# Patient Record
Sex: Female | Born: 1965
Health system: Southern US, Community
[De-identification: ages and names within clinical notes are randomized; demographics above are authoritative.]

## PROBLEM LIST (undated history)

## (undated) DIAGNOSIS — A159 Respiratory tuberculosis unspecified: Secondary | ICD-10-CM

## (undated) DIAGNOSIS — C44310 Basal cell carcinoma of skin of unspecified parts of face: Secondary | ICD-10-CM

## (undated) DIAGNOSIS — N809 Endometriosis, unspecified: Secondary | ICD-10-CM

## (undated) DIAGNOSIS — D219 Benign neoplasm of connective and other soft tissue, unspecified: Secondary | ICD-10-CM

## (undated) DIAGNOSIS — M722 Plantar fascial fibromatosis: Secondary | ICD-10-CM

## (undated) HISTORY — DX: Respiratory tuberculosis unspecified: A15.9

## (undated) HISTORY — DX: Plantar fascial fibromatosis: M72.2

## (undated) HISTORY — DX: Endometriosis, unspecified: N80.9

## (undated) HISTORY — PX: LEFT OOPHORECTOMY: SHX1961

## (undated) HISTORY — PX: OTHER SURGICAL HISTORY: SHX169

## (undated) HISTORY — PX: WISDOM TOOTH EXTRACTION: SHX21

## (undated) HISTORY — DX: Basal cell carcinoma of skin of unspecified parts of face: C44.310

## (undated) HISTORY — DX: Benign neoplasm of connective and other soft tissue, unspecified: D21.9

---

## 1994-06-20 HISTORY — PX: OVARY SURGERY: SHX727

## 2006-04-17 ENCOUNTER — Encounter (INDEPENDENT_AMBULATORY_CARE_PROVIDER_SITE_OTHER): Payer: Self-pay | Admitting: Gynecology

## 2006-04-17 ENCOUNTER — Ambulatory Visit: Payer: Self-pay | Admitting: Gynecology

## 2007-05-07 ENCOUNTER — Ambulatory Visit: Payer: Self-pay | Admitting: Gynecology

## 2007-05-07 ENCOUNTER — Encounter: Payer: Self-pay | Admitting: Obstetrics & Gynecology

## 2007-07-19 ENCOUNTER — Encounter: Admission: RE | Admit: 2007-07-19 | Discharge: 2007-07-19 | Payer: Self-pay | Admitting: Obstetrics & Gynecology

## 2007-10-08 ENCOUNTER — Ambulatory Visit: Payer: Self-pay | Admitting: Gynecology

## 2010-07-11 ENCOUNTER — Encounter: Payer: Self-pay | Admitting: Obstetrics & Gynecology

## 2010-11-02 NOTE — Assessment & Plan Note (Signed)
NAME:  ANNAYAH, WORTHLEY NO.:  [id number]   MEDICAL RECORD NO.:  [id number]          PATIENT TYPE:  POB   LOCATION:  [facility] at [facility]         FACILITY:  [facility]   PHYSICIAN:  Allie Bossier, MD        DATE OF BIRTH:  07/24/1965   DATE OF SERVICE:  05/07/2007                                  CLINIC NOTE   Ms. Scobey is a 45 year old married white gravida 2, para 2 with 27 and 8-  year-old children who comes here for an annual exam.  She has a couple  complaints.  She has been on Loestrin 24 and complains of consistent  breakthrough bleeding during the second week of each pill pack.  She  would like to have the Mirena IUD. She also complains of a persistent  vaginal discharge yellowish to clear and she would like this checked.   PAST MEDICAL HISTORY:  Endometriosis.   PAST SURGICAL HISTORY:  1. Laparoscopy in 1997.  2. Left salpingo-oophorectomy.  3. Bilateral knee surgeries.   ALLERGIES:  NO KNOWN LATEX ALLERGIES.  PENICILLIN IS A DRUG ALLERGY.   SOCIAL HISTORY:  Negative for tobacco, alcohol or drug use.   FAMILY HISTORY:  Negative for breast, GYN and colon malignancies.   REVIEW OF SYSTEMS:  She has been married for the last 8 years.  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  Her Pap smear was done in 03/2006 and a mammogram is due.   PHYSICAL EXAM:  Weight 143 pounds, height 5 foot 8, blood pressure  122/82, pulse 95.  HEENT:  Normal.  HEART:  Regular rate and rhythm.  LUNGS:  Clear to auscultation bilaterally.  ABDOMEN:  Benign.  BREASTS:  Normal.  PELVIC:  External genitalia normal.  Uterus normal size and shape,  anteverted, nonenlarged.  Adnexa-no pelvic masses.  No pain with exam.  Discharge on exam appeared normal.   ASSESSMENT/PLAN:  1. Annual exam.  I have checked a Pap smear with cervical cultures.      Recommended self-breast exams, self vulvar exams monthly and a      mammogram will be scheduled. for bone and  pain p.m. have a      temperature height 980 day.  2. Birth control.  I agree with Mirena as an excellent form of birth      control.  We will put this in with her next menstrual period.  She      will take Motrin prior to insertion.  With regard to her discharge      I will send a wet prep.      Allie Bossier, MD     MCD/MEDQ  D:  05/07/2007  T:  05/08/2007  Job:  [id number]

## 2011-04-04 ENCOUNTER — Encounter: Payer: Self-pay | Admitting: Obstetrics & Gynecology

## 2011-04-04 ENCOUNTER — Ambulatory Visit (INDEPENDENT_AMBULATORY_CARE_PROVIDER_SITE_OTHER): Payer: BC Managed Care – PPO | Admitting: Obstetrics & Gynecology

## 2011-04-04 VITALS — BP 118/76 | HR 89 | Ht 68.0 in | Wt 148.0 lb

## 2011-04-04 DIAGNOSIS — Z1231 Encounter for screening mammogram for malignant neoplasm of breast: Secondary | ICD-10-CM

## 2011-04-04 DIAGNOSIS — Z113 Encounter for screening for infections with a predominantly sexual mode of transmission: Secondary | ICD-10-CM

## 2011-04-04 DIAGNOSIS — Z1272 Encounter for screening for malignant neoplasm of vagina: Secondary | ICD-10-CM

## 2011-04-04 DIAGNOSIS — Z0000 Encounter for general adult medical examination without abnormal findings: Secondary | ICD-10-CM

## 2011-04-04 NOTE — Progress Notes (Signed)
  Subjective:    Patient ID: Wendy Myers, female    DOB: 01-10-1966, 45 y.o.   MRN: [id number]  HPI    Review of Systems     Objective:   Physical Exam        Assessment & Plan:   Subjective:    Wendy Myers is a 45 y.o. female who presents for an annual exam. The patient has no complaints today. The patient is sexually active. GYN screening history: last pap: was normal. The patient wears seatbelts: yes. The patient participates in regular exercise: yes. Has the patient ever been transfused or tattooed?: no. The patient reports that there is not domestic violence in her life.   Menstrual History: OB History    Grav Para Term Preterm Abortions TAB SAB Ect Mult Living   2         2      Menarche age: 38 No LMP recorded. Patient is not currently having periods (Reason: IUD).    The following portions of the patient's history were reviewed and updated as appropriate: allergies, current medications, past family history, past medical history, past social history, past surgical history and problem list.  Review of Systems A comprehensive review of systems was negative.   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 Objective:    BP 118/76  Pulse 89  Ht 5\' 8"  (1.727 m)  Wt 67.132 kg (148 lb)  BMI 22.50 kg/m2  General Appearance:    Alert, cooperative, no distress, appears stated age  Head:    Normocephalic, without obvious abnormality, atraumatic  Eyes:    PERRL, conjunctiva/corneas clear, EOM's intact, fundi    benign, both eyes  Ears:    Normal TM's and external ear canals, both ears  Nose:   Nares normal, septum midline, mucosa normal, no drainage    or sinus tenderness  Throat:   Lips, mucosa, and tongue normal; teeth and gums normal  Neck:   Supple, symmetrical, trachea midline, no adenopathy;    thyroid:  no enlargement/tenderness/nodules; no carotid   bruit or JVD  Back:      Symmetric, no curvature, ROM normal, no CVA tenderness  Lungs:     Clear to auscultation bilaterally, respirations unlabored  Chest Wall:    No tenderness or deformity   Heart:    Regular rate and rhythm, S1 and S2 normal, no murmur, rub   or gallop  Breast Exam:    No tenderness, masses, or nipple abnormality  Abdomen:     Soft, non-tender, bowel sounds active all four quadrants,    no masses, no organomegaly  Genitalia:    Normal female without lesion, discharge or tenderness  Rectal:     Extremities:   Extremities normal, atraumatic, no cyanosis or edema  Pulses:   2+ and symmetric all extremities  Skin:   Skin color, texture, turgor normal, no rashes or lesions  Lymph nodes:   Cervical, supraclavicular, and axillary nodes normal  Neurologic:   CNII-XII intact, normal strength, sensation and reflexes    throughout  .    Assessment:    Healthy female exam.    Plan:     Await pap smear results. Mammogram.  Schedule fasting labs

## 2011-04-06 NOTE — Progress Notes (Signed)
Addended by: Barbara Cower on: 04/06/2011 01:22 PM   Modules accepted: Orders

## 2011-04-07 ENCOUNTER — Other Ambulatory Visit: Payer: Self-pay

## 2011-04-11 ENCOUNTER — Other Ambulatory Visit: Payer: BC Managed Care – PPO

## 2011-04-13 ENCOUNTER — Other Ambulatory Visit: Payer: BC Managed Care – PPO

## 2011-04-28 ENCOUNTER — Ambulatory Visit
Admission: RE | Admit: 2011-04-28 | Discharge: 2011-04-28 | Disposition: A | Discharge status: Home / Self Care | Payer: BC Managed Care – PPO | Source: Ambulatory Visit | Attending: Obstetrics & Gynecology | Admitting: Obstetrics & Gynecology

## 2011-04-28 DIAGNOSIS — Z1231 Encounter for screening mammogram for malignant neoplasm of breast: Secondary | ICD-10-CM

## 2011-05-05 ENCOUNTER — Other Ambulatory Visit: Payer: Self-pay | Admitting: Obstetrics & Gynecology

## 2011-05-05 DIAGNOSIS — R928 Other abnormal and inconclusive findings on diagnostic imaging of breast: Secondary | ICD-10-CM

## 2011-05-19 ENCOUNTER — Ambulatory Visit
Admission: RE | Admit: 2011-05-19 | Discharge: 2011-05-19 | Disposition: A | Discharge status: Home / Self Care | Payer: BC Managed Care – PPO | Source: Ambulatory Visit | Attending: Obstetrics & Gynecology | Admitting: Obstetrics & Gynecology

## 2011-05-19 DIAGNOSIS — R928 Other abnormal and inconclusive findings on diagnostic imaging of breast: Secondary | ICD-10-CM

## 2012-04-25 ENCOUNTER — Ambulatory Visit: Payer: BC Managed Care – PPO | Admitting: Obstetrics & Gynecology

## 2012-04-25 DIAGNOSIS — Z01419 Encounter for gynecological examination (general) (routine) without abnormal findings: Secondary | ICD-10-CM

## 2012-05-01 ENCOUNTER — Ambulatory Visit: Payer: BC Managed Care – PPO | Admitting: Obstetrics & Gynecology

## 2012-05-01 DIAGNOSIS — Z30433 Encounter for removal and reinsertion of intrauterine contraceptive device: Secondary | ICD-10-CM

## 2012-10-11 ENCOUNTER — Ambulatory Visit: Payer: BC Managed Care – PPO | Admitting: Obstetrics & Gynecology

## 2012-10-16 ENCOUNTER — Ambulatory Visit (INDEPENDENT_AMBULATORY_CARE_PROVIDER_SITE_OTHER): Payer: 59 | Admitting: Obstetrics & Gynecology

## 2012-10-16 ENCOUNTER — Encounter: Payer: Self-pay | Admitting: Obstetrics & Gynecology

## 2012-10-16 VITALS — BP 125/85 | HR 75 | Ht 68.0 in | Wt 148.0 lb

## 2012-10-16 DIAGNOSIS — Z30433 Encounter for removal and reinsertion of intrauterine contraceptive device: Secondary | ICD-10-CM

## 2012-10-16 DIAGNOSIS — Z975 Presence of (intrauterine) contraceptive device: Secondary | ICD-10-CM

## 2012-10-16 DIAGNOSIS — Z0000 Encounter for general adult medical examination without abnormal findings: Secondary | ICD-10-CM

## 2012-10-16 NOTE — Progress Notes (Signed)
Here today for gyn physical and pap smear.  Her IUD needs to be removed, it is now expired. Wants to discuss [facility] options.

## 2012-10-16 NOTE — Progress Notes (Signed)
  Subjective:    Patient ID: Wendy Myers, female    DOB: 13-Oct-1965, 47 y.o.   MRN: [id number]  HPI  Ms. Wendy Myers is here for a replacement of her newly expired Mirena. She denies hot flashes or other menopausal symptoms.   Review of Systems     Objective:   Physical Exam  Consent signed, Time out procedure done.I easily removed her Mirena. Cervix prepped with betadine. Mirena was easily placed and the strings were cut to 3-4 cm. Uterus sounded to 9 cm. She tolerated the procedure well.     Assessment & Plan:   Contraception- Mirena RTC 4 weeks for annual/labs/mammogram scheduled

## 2012-11-15 ENCOUNTER — Encounter: Payer: Self-pay | Admitting: Obstetrics & Gynecology

## 2012-11-15 ENCOUNTER — Ambulatory Visit (INDEPENDENT_AMBULATORY_CARE_PROVIDER_SITE_OTHER): Payer: 59 | Admitting: Obstetrics & Gynecology

## 2012-11-15 VITALS — BP 110/70 | HR 78 | Resp 16 | Ht 68.0 in | Wt 147.0 lb

## 2012-11-15 DIAGNOSIS — Z0000 Encounter for general adult medical examination without abnormal findings: Secondary | ICD-10-CM

## 2012-11-15 DIAGNOSIS — Z1151 Encounter for screening for human papillomavirus (HPV): Secondary | ICD-10-CM

## 2012-11-15 DIAGNOSIS — Z01419 Encounter for gynecological examination (general) (routine) without abnormal findings: Secondary | ICD-10-CM

## 2012-11-15 DIAGNOSIS — Z124 Encounter for screening for malignant neoplasm of cervix: Secondary | ICD-10-CM

## 2012-11-15 LAB — CBC
Hemoglobin: 13.7 g/dL (ref 12.0–15.0)
MCHC: 34 g/dL (ref 30.0–36.0)
RDW: 13.6 % (ref 11.5–15.5)
WBC: 6.3 10*3/uL (ref 4.0–10.5)

## 2012-11-15 NOTE — Progress Notes (Signed)
Subjective:    Wendy Myers is a 47 y.o. female who presents for an annual exam. The patient has no complaints today. The patient is sexually active. GYN screening history: last pap: was normal. The patient wears seatbelts: yes. The patient participates in regular exercise: yes. Has the patient ever been transfused or tattooed?: no. The patient reports that there is not domestic violence in her life.   Menstrual History: OB History   Grav Para Term Preterm Abortions TAB SAB Ect Mult Living   2         2      Menarche age: 33  No LMP recorded. Patient is not currently having periods (Reason: IUD).    The following portions of the patient's history were reviewed and updated as appropriate: allergies, current medications, past family history, past medical history, past social history, past surgical history and problem list.  Review of Systems A comprehensive review of systems was negative. 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 Her mammogram is scheduled for next week.   Objective:    BP 110/70  Pulse 78  Resp 16  Ht 5\' 8"  (1.727 m)  Wt 147 lb (66.679 kg)  BMI 22.36 kg/m2  General Appearance:    Alert, cooperative, no distress, appears stated age  Head:    Normocephalic, without obvious abnormality, atraumatic  Eyes:    PERRL, conjunctiva/corneas clear, EOM's intact, fundi    benign, both eyes  Ears:    Normal TM's and external ear canals, both ears  Nose:   Nares normal, septum midline, mucosa normal, no drainage    or sinus tenderness  Throat:   Lips, mucosa, and tongue normal; teeth and gums normal  Neck:   Supple, symmetrical, trachea midline, no adenopathy;    thyroid:  no enlargement/tenderness/nodules; no carotid   bruit or JVD  Back:     Symmetric, no curvature, ROM normal, no CVA tenderness  Lungs:     Clear to auscultation bilaterally, respirations unlabored  Chest Wall:    No tenderness or  deformity   Heart:    Regular rate and rhythm, S1 and S2 normal, no murmur, rub   or gallop  Breast Exam:    No tenderness, masses, or nipple abnormality  Abdomen:     Soft, non-tender, bowel sounds active all four quadrants,    no masses, no organomegaly  Genitalia:    Normal female without lesion, discharge or tenderness, NSSA, NT, mobile, normal adnexal exam     Extremities:   Extremities normal, atraumatic, no cyanosis or edema  Pulses:   2+ and symmetric all extremities  Skin:   Skin color, texture, turgor normal, no rashes or lesions  Lymph nodes:   Cervical, supraclavicular, and axillary nodes normal  Neurologic:   CNII-XII intact, normal strength, sensation and reflexes    throughout  .    Assessment:    Healthy female exam.    Plan:     Thin prep Pap smear.  with HPV cotesting Screening fasting labs today Mammogram next week

## 2012-11-16 LAB — COMPREHENSIVE METABOLIC PANEL
AST: 16 U/L (ref 0–37)
Albumin: 4.4 g/dL (ref 3.5–5.2)
Alkaline Phosphatase: 52 U/L (ref 39–117)
Chloride: 102 mEq/L (ref 96–112)
Potassium: 3.8 mEq/L (ref 3.5–5.3)
Sodium: 138 mEq/L (ref 135–145)
Total Protein: 6.5 g/dL (ref 6.0–8.3)

## 2014-04-21 ENCOUNTER — Encounter: Payer: Self-pay | Admitting: Obstetrics & Gynecology

## 2015-06-23 ENCOUNTER — Ambulatory Visit: Payer: 59 | Admitting: Obstetrics & Gynecology

## 2015-07-09 ENCOUNTER — Ambulatory Visit (INDEPENDENT_AMBULATORY_CARE_PROVIDER_SITE_OTHER): Payer: 59 | Admitting: Obstetrics & Gynecology

## 2015-07-09 ENCOUNTER — Other Ambulatory Visit (HOSPITAL_COMMUNITY): Payer: Self-pay | Admitting: Obstetrics & Gynecology

## 2015-07-09 ENCOUNTER — Encounter: Payer: Self-pay | Admitting: Obstetrics & Gynecology

## 2015-07-09 VITALS — BP 131/85 | HR 80 | Resp 18 | Ht 68.0 in | Wt 150.0 lb

## 2015-07-09 DIAGNOSIS — Z01419 Encounter for gynecological examination (general) (routine) without abnormal findings: Secondary | ICD-10-CM

## 2015-07-09 DIAGNOSIS — Z0000 Encounter for general adult medical examination without abnormal findings: Secondary | ICD-10-CM

## 2015-07-09 DIAGNOSIS — R1031 Right lower quadrant pain: Secondary | ICD-10-CM | POA: Diagnosis not present

## 2015-07-09 DIAGNOSIS — Z124 Encounter for screening for malignant neoplasm of cervix: Secondary | ICD-10-CM

## 2015-07-09 DIAGNOSIS — Z1151 Encounter for screening for human papillomavirus (HPV): Secondary | ICD-10-CM

## 2015-07-09 DIAGNOSIS — Z1231 Encounter for screening mammogram for malignant neoplasm of breast: Secondary | ICD-10-CM

## 2015-07-09 NOTE — Progress Notes (Signed)
Subjective:    Wendy Myers is a 50 y.o. MW P2 (51 yo girl and 62 yo son)  female who presents for an annual exam. The patient has no complaints today except some RLQ pain for the last 4 months. H/O endometrioma with LSO. Likes her Mirena. The patient is sexually active. GYN screening history: last pap: was normal. The patient wears seatbelts: yes. The patient participates in regular exercise: yes. Has the patient ever been transfused or tattooed?: no. The patient reports that there is not domestic violence in her life.   Menstrual History: OB History    Gravida Para Term Preterm AB TAB SAB Ectopic Multiple Living   2         2      Menarche age: 74  No LMP recorded. Patient is not currently having periods (Reason: IUD).    The following portions of the patient's history were reviewed and updated as appropriate: allergies, current medications, past family history, past medical history, past social history, past surgical history and problem list.  Review of Systems Pertinent items noted in HPI and remainder of comprehensive ROS otherwise negative.  Had a flu vaccine, declines colonoscopy at this time. No periods, no bleeding at all! Works at [employer] in the [employer].   Objective:    BP 131/85 mmHg  Pulse 80  Resp 18  Ht 5\' 8"  (1.727 m)  Wt 150 lb (68.04 kg)  BMI 22.81 kg/m2  General Appearance:    Alert, cooperative, no distress, appears stated age  Head:    Normocephalic, without obvious abnormality, atraumatic  Eyes:    PERRL, conjunctiva/corneas clear, EOM's intact, fundi    benign, both eyes  Ears:    Normal TM's and external ear canals, both ears  Nose:   Nares normal, septum midline, mucosa normal, no drainage    or sinus tenderness  Throat:   Lips, mucosa, and tongue normal; teeth and gums normal  Neck:   Supple, symmetrical, trachea midline, no adenopathy;    thyroid:  no enlargement/tenderness/nodules; no carotid   bruit or JVD  Back:     Symmetric, no  curvature, ROM normal, no CVA tenderness  Lungs:     Clear to auscultation bilaterally, respirations unlabored  Chest Wall:    No tenderness or deformity   Heart:    Regular rate and rhythm, S1 and S2 normal, no murmur, rub   or gallop  Breast Exam:    No tenderness, masses, or nipple abnormality  Abdomen:     Soft, non-tender, bowel sounds active all four quadrants,    no masses, no organomegaly  Genitalia:    Normal female without lesion, discharge or tenderness, NSSA, NT, mobile, possible right ovarian cyst     Extremities:   Extremities normal, atraumatic, no cyanosis or edema  Pulses:   2+ and symmetric all extremities  Skin:   Skin color, texture, turgor normal, no rashes or lesions  Lymph nodes:   Cervical, supraclavicular, and axillary nodes normal  Neurologic:   CNII-XII intact, normal strength, sensation and reflexes    throughout  .    Assessment:    Healthy female exam.   RLQ pain   Plan:     Breast self exam technique reviewed and patient encouraged to perform self-exam monthly. Mammogram. Thin prep Pap smear. with cotesting Gyn u/s

## 2015-07-09 NOTE — Progress Notes (Signed)
Pt here today for annual physical exam, has occasional discomfort on right lower pelvic area from time to time.

## 2015-07-13 LAB — CYTOLOGY - PAP

## 2015-07-14 ENCOUNTER — Other Ambulatory Visit (INDEPENDENT_AMBULATORY_CARE_PROVIDER_SITE_OTHER): Payer: 59 | Admitting: *Deleted

## 2015-07-14 DIAGNOSIS — R102 Pelvic and perineal pain: Secondary | ICD-10-CM

## 2015-07-14 DIAGNOSIS — Z01419 Encounter for gynecological examination (general) (routine) without abnormal findings: Secondary | ICD-10-CM

## 2015-07-14 DIAGNOSIS — Z0000 Encounter for general adult medical examination without abnormal findings: Secondary | ICD-10-CM | POA: Diagnosis not present

## 2015-07-14 NOTE — Progress Notes (Signed)
Pt here today for fasting labs, labs drawn.  Order placed for pelvic/transvaginal US for possible ovarian cyst. Pt will schedule appointment.

## 2015-07-14 NOTE — Addendum Note (Signed)
Addended by: Ricka Burdock on: 07/14/2015 08:19 AM   Modules accepted: Orders

## 2015-07-15 LAB — LIPID PANEL
Cholesterol: 166 mg/dL (ref 125–200)
HDL: 75 mg/dL (ref >=46)
LDL CALC: 78 mg/dL (ref <130)
Total CHOL/HDL Ratio: 2.2 Ratio (ref <=5.0)
Triglycerides: 63 mg/dL (ref <150)
VLDL: 13 mg/dL (ref <30)

## 2015-07-15 LAB — COMPREHENSIVE METABOLIC PANEL
ALK PHOS: 59 U/L (ref 33–130)
ALT: 11 U/L (ref 6–29)
AST: 12 U/L (ref 10–35)
Albumin: 4.8 g/dL (ref 3.6–5.1)
BILIRUBIN TOTAL: 0.6 mg/dL (ref 0.2–1.2)
BUN: 15 mg/dL (ref 7–25)
CHLORIDE: 101 mmol/L (ref 98–110)
CO2: 33 mmol/L — ABNORMAL HIGH (ref 20–31)
CREATININE: 0.65 mg/dL (ref 0.50–1.05)
Calcium: 9.5 mg/dL (ref 8.6–10.4)
Glucose, Bld: 97 mg/dL (ref 65–99)
Potassium: 3.9 mmol/L (ref 3.5–5.3)
SODIUM: 140 mmol/L (ref 135–146)
TOTAL PROTEIN: 6.8 g/dL (ref 6.1–8.1)

## 2015-07-15 LAB — CBC
HCT: 42.3 % (ref 36.0–46.0)
HEMOGLOBIN: 14.8 g/dL (ref 12.0–15.0)
MCH: 29.2 pg (ref 26.0–34.0)
MCHC: 35 g/dL (ref 30.0–36.0)
MCV: 83.4 fL (ref 78.0–100.0)
MPV: 9.2 fL (ref 8.6–12.4)
PLATELETS: 244 10*3/uL (ref 150–400)
RBC: 5.07 MIL/uL (ref 3.87–5.11)
RDW: 13.3 % (ref 11.5–15.5)
WBC: 4.4 10*3/uL (ref 4.0–10.5)

## 2015-07-15 LAB — VITAMIN D 25 HYDROXY (VIT D DEFICIENCY, FRACTURES): VIT D 25 HYDROXY: 40 ng/mL (ref 30–100)

## 2015-07-15 LAB — TSH: TSH: 1.851 u[IU]/mL (ref 0.350–4.500)

## 2015-07-15 LAB — FOLLICLE STIMULATING HORMONE: FSH: 125.6 m[IU]/mL — ABNORMAL HIGH

## 2015-07-22 ENCOUNTER — Ambulatory Visit (HOSPITAL_COMMUNITY)
Admission: RE | Admit: 2015-07-22 | Discharge: 2015-07-22 | Disposition: A | Discharge status: Home / Self Care | Payer: 59 | Source: Ambulatory Visit | Attending: Obstetrics & Gynecology | Admitting: Obstetrics & Gynecology

## 2015-07-22 DIAGNOSIS — R1031 Right lower quadrant pain: Secondary | ICD-10-CM | POA: Insufficient documentation

## 2015-07-22 DIAGNOSIS — R102 Pelvic and perineal pain: Secondary | ICD-10-CM

## 2015-07-22 DIAGNOSIS — Z975 Presence of (intrauterine) contraceptive device: Secondary | ICD-10-CM | POA: Insufficient documentation

## 2015-07-22 DIAGNOSIS — D259 Leiomyoma of uterus, unspecified: Secondary | ICD-10-CM | POA: Insufficient documentation

## 2015-07-23 ENCOUNTER — Ambulatory Visit
Admission: RE | Admit: 2015-07-23 | Discharge: 2015-07-23 | Disposition: A | Discharge status: Home / Self Care | Payer: 59 | Source: Ambulatory Visit | Attending: Obstetrics & Gynecology | Admitting: Obstetrics & Gynecology

## 2015-07-23 DIAGNOSIS — Z1231 Encounter for screening mammogram for malignant neoplasm of breast: Secondary | ICD-10-CM

## 2015-07-28 ENCOUNTER — Ambulatory Visit (INDEPENDENT_AMBULATORY_CARE_PROVIDER_SITE_OTHER): Payer: 59 | Admitting: Obstetrics & Gynecology

## 2015-07-28 ENCOUNTER — Encounter: Payer: Self-pay | Admitting: Obstetrics & Gynecology

## 2015-07-28 VITALS — BP 139/93 | HR 103 | Wt 152.0 lb

## 2015-07-28 DIAGNOSIS — R1031 Right lower quadrant pain: Secondary | ICD-10-CM | POA: Diagnosis not present

## 2015-07-28 DIAGNOSIS — Z30432 Encounter for removal of intrauterine contraceptive device: Secondary | ICD-10-CM | POA: Diagnosis not present

## 2015-07-28 DIAGNOSIS — Z0000 Encounter for general adult medical examination without abnormal findings: Secondary | ICD-10-CM

## 2015-07-28 NOTE — Progress Notes (Signed)
   Subjective:    Patient ID: Wendy Myers, female    DOB: [birth year], 50 y.o.   MRN: [id number]  HPI  50 yo MW here to discuss her RLQ pain. Her u/s was normal. Her FSH was 127.  Review of Systems     Objective:   Physical Exam  [id number] Breathing, conversing, and ambulating normally Abd- benign Marked vulvar atrophy Mirena removed easily and noted to be intact       Assessment & Plan:  Due to her RLQ pain, some point tenderness with palpation by her. If IUD removal doesn't resolve her pain, then I would suggest a chiropractor, GI, or urologist. She will need a colonoscopy as she is 50 yo

## 2015-08-06 ENCOUNTER — Encounter: Payer: Self-pay | Admitting: *Deleted

## 2016-01-27 DIAGNOSIS — L821 Other seborrheic keratosis: Secondary | ICD-10-CM | POA: Diagnosis not present

## 2016-01-27 DIAGNOSIS — D485 Neoplasm of uncertain behavior of skin: Secondary | ICD-10-CM | POA: Diagnosis not present

## 2016-01-27 DIAGNOSIS — C44311 Basal cell carcinoma of skin of nose: Secondary | ICD-10-CM | POA: Diagnosis not present

## 2016-01-27 DIAGNOSIS — D229 Melanocytic nevi, unspecified: Secondary | ICD-10-CM | POA: Diagnosis not present

## 2016-02-09 DIAGNOSIS — J029 Acute pharyngitis, unspecified: Secondary | ICD-10-CM | POA: Diagnosis not present

## 2016-03-31 DIAGNOSIS — L821 Other seborrheic keratosis: Secondary | ICD-10-CM | POA: Diagnosis not present

## 2016-03-31 DIAGNOSIS — C44311 Basal cell carcinoma of skin of nose: Secondary | ICD-10-CM | POA: Diagnosis not present

## 2016-03-31 DIAGNOSIS — D2239 Melanocytic nevi of other parts of face: Secondary | ICD-10-CM | POA: Diagnosis not present

## 2016-03-31 DIAGNOSIS — D485 Neoplasm of uncertain behavior of skin: Secondary | ICD-10-CM | POA: Diagnosis not present

## 2016-09-05 DIAGNOSIS — D485 Neoplasm of uncertain behavior of skin: Secondary | ICD-10-CM | POA: Diagnosis not present

## 2016-09-05 DIAGNOSIS — D220 Melanocytic nevi of lip: Secondary | ICD-10-CM | POA: Diagnosis not present

## 2016-09-05 DIAGNOSIS — L821 Other seborrheic keratosis: Secondary | ICD-10-CM | POA: Diagnosis not present

## 2016-12-14 DIAGNOSIS — L089 Local infection of the skin and subcutaneous tissue, unspecified: Secondary | ICD-10-CM | POA: Diagnosis not present

## 2016-12-14 DIAGNOSIS — Z85828 Personal history of other malignant neoplasm of skin: Secondary | ICD-10-CM | POA: Diagnosis not present

## 2016-12-14 DIAGNOSIS — D485 Neoplasm of uncertain behavior of skin: Secondary | ICD-10-CM | POA: Diagnosis not present

## 2016-12-14 DIAGNOSIS — D229 Melanocytic nevi, unspecified: Secondary | ICD-10-CM | POA: Diagnosis not present

## 2017-02-09 DIAGNOSIS — H00012 Hordeolum externum right lower eyelid: Secondary | ICD-10-CM | POA: Diagnosis not present

## 2017-02-09 DIAGNOSIS — B029 Zoster without complications: Secondary | ICD-10-CM | POA: Diagnosis not present

## 2017-04-25 DIAGNOSIS — L308 Other specified dermatitis: Secondary | ICD-10-CM | POA: Diagnosis not present

## 2017-06-09 ENCOUNTER — Ambulatory Visit (INDEPENDENT_AMBULATORY_CARE_PROVIDER_SITE_OTHER): Payer: 59 | Admitting: Obstetrics & Gynecology

## 2017-06-09 ENCOUNTER — Encounter: Payer: Self-pay | Admitting: Obstetrics & Gynecology

## 2017-06-09 VITALS — BP 121/81 | HR 91 | Wt 146.2 lb

## 2017-06-09 DIAGNOSIS — Z1151 Encounter for screening for human papillomavirus (HPV): Secondary | ICD-10-CM

## 2017-06-09 DIAGNOSIS — Z01419 Encounter for gynecological examination (general) (routine) without abnormal findings: Secondary | ICD-10-CM | POA: Diagnosis not present

## 2017-06-09 DIAGNOSIS — N393 Stress incontinence (female) (male): Secondary | ICD-10-CM

## 2017-06-09 DIAGNOSIS — Z124 Encounter for screening for malignant neoplasm of cervix: Secondary | ICD-10-CM | POA: Diagnosis not present

## 2017-06-09 NOTE — Progress Notes (Signed)
Subjective:    Wendy Myers is a 51 y.o. married White P2 (34 yo daughter and 78 yo son) female who presents for an annual exam. She has been having GSUI. The patient is sexually active. GYN screening history: last pap: was normal. The patient wears seatbelts: yes. The patient participates in regular exercise: yes. Has the patient ever been transfused or tattooed?: no. The patient reports that there is not domestic violence in her life.   Menstrual History: OB History    Gravida Para Term Preterm AB Living   2         2   SAB TAB Ectopic Multiple Live Births           2      Menarche age: 31  She is menopausal. Her IUD was removed 2 years ago.  The following portions of the patient's history were reviewed and updated as appropriate: allergies, current medications, past family history, past medical history, past social history, past surgical history and problem list.  Review of Systems Pertinent items are noted in HPI.   Married for 18 years, he uses condoms She has hot flashes, minimal dryness Works for [employer] Has had flu vaccine Mammogram, shingle vac, and colonoscopy FH- no breast, gyn, colon cancer   Objective:    BP 121/81   Pulse 91   Wt 146 lb 3.2 oz (66.3 kg)   BMI 22.23 kg/m   General Appearance:    Alert, cooperative, no distress, appears stated age  Head:    Normocephalic, without obvious abnormality, atraumatic  Eyes:    PERRL, conjunctiva/corneas clear, EOM's intact, fundi    benign, both eyes  Ears:    Normal TM's and external ear canals, both ears  Nose:   Nares normal, septum midline, mucosa normal, no drainage    or sinus tenderness  Throat:   Lips, mucosa, and tongue normal; teeth and gums normal  Neck:   Supple, symmetrical, trachea midline, no adenopathy;    thyroid:  no enlargement/tenderness/nodules; no carotid   bruit or JVD  Back:     Symmetric, no curvature, ROM normal, no CVA tenderness  Lungs:     Clear to auscultation bilaterally,  respirations unlabored  Chest Wall:    No tenderness or deformity   Heart:    Regular rate and rhythm, S1 and S2 normal, no murmur, rub   or gallop  Breast Exam:    No tenderness, masses, or nipple abnormality  Abdomen:     Soft, non-tender, bowel sounds active all four quadrants,    no masses, no organomegaly  Genitalia:    Normal female without lesion, discharge or tenderness, NSSA, non tender, mobile, normal adnexal exam     Extremities:   Extremities normal, atraumatic, no cyanosis or edema  Pulses:   2+ and symmetric all extremities  Skin:   Skin color, texture, turgor normal, no rashes or lesions  Lymph nodes:   Cervical, supraclavicular, and axillary nodes normal  Neurologic:   CNII-XII intact, normal strength, sensation and reflexes    throughout  .    Assessment:    Healthy female exam.   GSUI   Plan:     Thin prep Pap smear. with cotesting Fam med for vac and cologard on Jun 15, 2017 Mammogram ordered Refer to urology for possible sling

## 2017-06-14 LAB — CYTOLOGY - PAP
DIAGNOSIS: NEGATIVE
HPV (WINDOPATH): NOT DETECTED

## 2017-06-15 ENCOUNTER — Encounter: Payer: Self-pay | Admitting: Internal Medicine

## 2017-06-15 ENCOUNTER — Ambulatory Visit (INDEPENDENT_AMBULATORY_CARE_PROVIDER_SITE_OTHER): Payer: 59 | Admitting: Internal Medicine

## 2017-06-15 VITALS — BP 110/70 | HR 100 | Temp 98.8°F | Resp 16 | Ht 66.5 in | Wt 146.5 lb

## 2017-06-15 DIAGNOSIS — Z1329 Encounter for screening for other suspected endocrine disorder: Secondary | ICD-10-CM | POA: Diagnosis not present

## 2017-06-15 DIAGNOSIS — Z0000 Encounter for general adult medical examination without abnormal findings: Secondary | ICD-10-CM

## 2017-06-15 DIAGNOSIS — Z13220 Encounter for screening for lipoid disorders: Secondary | ICD-10-CM

## 2017-06-15 DIAGNOSIS — Z1159 Encounter for screening for other viral diseases: Secondary | ICD-10-CM | POA: Diagnosis not present

## 2017-06-15 NOTE — Progress Notes (Signed)
Chief Complaint  Patient presents with  . Establish Care   Establish care sees Dr. Tana Felts [facility] Ob/GYN  1. No complaints today     Review of Systems  Constitutional: Negative for weight loss.  HENT: Negative for hearing loss.   Eyes:       No vision problems   Respiratory: Negative for shortness of breath.   Cardiovascular: Negative for chest pain.  Gastrointestinal: Negative for abdominal pain.  Musculoskeletal: Negative for falls.  Skin: Negative for rash.  Neurological: Negative for headaches.  Psychiatric/Behavioral: Negative for memory loss.   Past Medical History:  Diagnosis Date  . Endometriosis    with removal left fallopian tube and ovary age 6   . Facial basal cell cancer    removed 5/12 Dr. Nehemiah Massed   . Plantar fasciitis of right foot   . Tuberculosis    postitive TB skin test age [age]    Past Surgical History:  Procedure Laterality Date  . LEFT OOPHORECTOMY    . OTHER SURGICAL HISTORY     left fallopian tube removal   . OVARY SURGERY  1996   left ovary and fallopian / endometruim  . WISDOM TOOTH EXTRACTION      x4   Family History  Problem Relation Age of Onset  . Dementia Maternal Grandmother   . Heart disease Paternal Grandfather 34       CHF  . Heart disease Maternal Grandfather        MI   Social History   Socioeconomic History  . Marital status: Married    Spouse name: Not on file  . Number of children: Not on file  . Years of education: Not on file  . Highest education level: Not on file  Social Needs  . Financial resource strain: Not on file  . Food insecurity - worry: Not on file  . Food insecurity - inability: Not on file  . Transportation needs - medical: Not on file  . Transportation needs - non-medical: Not on file  Occupational History  . Not on file  Tobacco Use  . Smoking status: Never Smoker  . Smokeless tobacco: Never Used  Substance and Sexual Activity  . Alcohol use: No  . Drug use: No  . Sexual activity:  Yes    Partners: Male    Birth control/protection: IUD  Other Topics Concern  . Not on file  Social History Narrative  . Not on file   Current Meds  Medication Sig  . Multiple Vitamin (MULTIVITAMIN) capsule Take 1 capsule by mouth daily.     Allergies  Allergen Reactions  . Penicillins Hives   Recent Results (from the past 2160 hour(s))  Cytology - PAP     Status: None   Collection Time: 06/09/17 12:00 AM  Result Value Ref Range   Adequacy      Satisfactory for evaluation  endocervical/transformation zone component PRESENT.   Diagnosis      NEGATIVE FOR INTRAEPITHELIAL LESIONS OR MALIGNANCY.   HPV NOT DETECTED     Comment: Normal Reference Range - NOT Detected   Material Submitted CervicoVaginal Pap [ThinPrep Imaged]    Objective  Body mass index is 23.29 kg/m. Wt Readings from Last 3 Encounters:  06/15/17 146 lb 8 oz (66.5 kg)  06/09/17 146 lb 3.2 oz (66.3 kg)  07/28/15 152 lb (68.9 kg)   Temp Readings from Last 3 Encounters:  06/15/17 98.8 F (37.1 C) (Oral)   BP Readings from Last 3 Encounters:  06/15/17  110/70  06/09/17 121/81  07/28/15 (!) 139/93   Pulse Readings from Last 3 Encounters:  06/15/17 100  06/09/17 91  07/28/15 (!) 103  O2 sat 98 % room air   Physical Exam  Constitutional: She is oriented to person, place, and time and well-developed, well-nourished, and in no distress. Vital signs are normal.  HENT:  Head: Normocephalic and atraumatic.  Mouth/Throat: Oropharynx is clear and moist and mucous membranes are normal.  Eyes: Conjunctivae are normal. Pupils are equal, round, and reactive to light.  Cardiovascular: Normal rate, regular rhythm and normal heart sounds.  No murmur heard. Neg leg edema b/l   Pulmonary/Chest: Effort normal and breath sounds normal.  Abdominal: Soft. Bowel sounds are normal. There is no tenderness.  Neurological: She is alert and oriented to person, place, and time. Gait normal. Gait normal.  Skin: Skin is warm, dry  and intact.  Psychiatric: Mood, memory, affect and judgment normal.  Nursing note and vitals reviewed.   Assessment   1. Wellness exam/HM  Plan  1.  Had flu  Disc Tdap, shingrix today, check hep B status  Pap 06/09/17 neg, neg hpv ob/gyn  mammo sch 06/2017  Pt does not want to do colonoscopy now given info on cologaurd to call insurance for price  F/u dermatology Dr. Nehemiah Massed as sch  Declines STD check   Labs mid Jan CMET, CBC, UA, TSH, T4, lipid, hep B  Provider: Dr. Olivia Mackie McLean-Scocuzza-Internal Medicine

## 2017-06-15 NOTE — Patient Instructions (Addendum)
Think about Tdap, Shingrix, cologaurd  Schedule fasting labs mid January  F/u in 6-12 months sooner if needed    DTaP Vaccine (Diphtheria, Tetanus, and Pertussis): What You Need to Know 1. Why get vaccinated? Diphtheria, tetanus, and pertussis are serious diseases caused by bacteria. Diphtheria and pertussis are spread from person to person. Tetanus enters the body through cuts or wounds. DIPHTHERIA causes a thick covering in the back of the throat.  It can lead to breathing problems, paralysis, heart failure, and even death.  TETANUS (Lockjaw) causes painful tightening of the muscles, usually all over the body.  It can lead to "locking" of the jaw so the victim cannot open his mouth or swallow. Tetanus leads to death in up to 2 out of 10 cases.  PERTUSSIS (Whooping Cough) causes coughing spells so bad that it is hard for infants to eat, drink, or breathe. These spells can last for weeks.  It can lead to pneumonia, seizures (jerking and staring spells), brain damage, and death.  Diphtheria, tetanus, and pertussis vaccine (DTaP) can help prevent these diseases. Most children who are vaccinated with DTaP will be protected throughout childhood. Many more children would get these diseases if we stopped vaccinating. DTaP is a safer version of an older vaccine called DTP. DTP is no longer used in the [place]. 2. Who should get DTaP vaccine and when? Children should get 5 doses of DTaP vaccine, one dose at each of the following ages:  2 months  4 months  6 months  15-18 months  4-6 years  DTaP may be given at the same time as other vaccines. 3. Some children should not get DTaP vaccine or should wait  Children with minor illnesses, such as a cold, may be vaccinated. But children who are moderately or severely ill should usually wait until they recover before getting DTaP vaccine.  Any child who had a life-threatening allergic reaction after a dose of DTaP should not get another  dose.  Any child who suffered a brain or nervous system disease within 7 days after a dose of DTaP should not get another dose.  Talk with your doctor if your child: ? had a seizure or collapsed after a dose of DTaP, ? cried non-stop for 3 hours or more after a dose of DTaP, ? had a fever over 105F after a dose of DTaP. Ask your doctor for more information. Some of these children should not get another dose of pertussis vaccine, but may get a vaccine without pertussis, called DT. 4. Older children and adults DTaP is not licensed for adolescents, adults, or children 31 years of age and older. But older people still need protection. A vaccine called Tdap is similar to DTaP. A single dose of Tdap is recommended for people 11 through 52 years of age. Another vaccine, called Td, protects against tetanus and diphtheria, but not pertussis. It is recommended every 10 years. There are separate Vaccine Information Statements for these vaccines. 5. What are the risks from DTaP vaccine? Getting diphtheria, tetanus, or pertussis disease is much riskier than getting DTaP vaccine. However, a vaccine, like any medicine, is capable of causing serious problems, such as severe allergic reactions. The risk of DTaP vaccine causing serious harm, or death, is extremely small. Mild problems (common)  Fever (up to about 1 child in 4)  Redness or swelling where the shot was given (up to about 1 child in 4)  Soreness or tenderness where the shot was given (up to  about 1 child in 4) These problems occur more often after the 4th and 5th doses of the DTaP series than after earlier doses. Sometimes the 4th or 5th dose of DTaP vaccine is followed by swelling of the entire arm or leg in which the shot was given, lasting 1-7 days (up to about 1 child in 28). Other mild problems include:  Fussiness (up to about 1 child in 3)  Tiredness or poor appetite (up to about 1 child in 10)  Vomiting (up to about 1 child in  46) These problems generally occur 1-3 days after the shot. Moderate problems (uncommon)  Seizure (jerking or staring) (about 1 child out of 14,000)  Non-stop crying, for 3 hours or more (up to about 1 child out of 1,000)  High fever, over 105F (about 1 child out of 16,000) Severe problems (very rare)  Serious allergic reaction (less than 1 out of a million doses)  Several other severe problems have been reported after DTaP vaccine. These include: ? Long-term seizures, coma, or lowered consciousness ? Permanent brain damage. These are so rare it is hard to tell if they are caused by the vaccine. Controlling fever is especially important for children who have had seizures, for any reason. It is also important if another family member has had seizures. You can reduce fever and pain by giving your child an aspirin-free pain reliever when the shot is given, and for the next 24 hours, following the package instructions. 6. What if there is a serious reaction? What should I look for? Look for anything that concerns you, such as signs of a severe allergic reaction, very high fever, or behavior changes. Signs of a severe allergic reaction can include hives, swelling of the face and throat, difficulty breathing, a fast heartbeat, dizziness, and weakness. These would start a few minutes to a few hours after the vaccination. What should I do?  If you think it is a severe allergic reaction or other emergency that can't wait, call 9-1-1 or get the person to the nearest hospital. Otherwise, call your doctor.  Afterward, the reaction should be reported to the Vaccine Adverse Event Reporting System (VAERS). Your doctor might file this report, or you can do it yourself through the VAERS web site at www.vaers.SamedayNews.es, or by calling 303-785-5305. ? VAERS is only for reporting reactions. They do not give medical advice. 7. The National Vaccine Injury Compensation Program The [employer] Vaccine Injury  Compensation Program (VICP) is a federal program that was created to compensate people who may have been injured by certain vaccines. Persons who believe they may have been injured by a vaccine can learn about the program and about filing a claim by calling 223-117-6454 or visiting the [place] website at GoldCloset.com.ee. 8. How can I learn more?  Ask your doctor.  Call your local or state health department.  Contact the Centers for Disease Control and Prevention (CDC): ? Call 319-283-3201 (1-800-CDC-INFO) or ? Visit CDC's website at http://hunter.com/ CDC DTaP Vaccine (Diphtheria, Tetanus, and Pertussis) VIS (11/03/05) This information is not intended to replace advice given to you by your health care provider. Make sure you discuss any questions you have with your health care provider. Document Released: 04/03/2006 Document Revised: 02/25/2016 Document Reviewed: 02/25/2016 Elsevier Interactive Patient Education  2017 [employer].

## 2017-07-03 ENCOUNTER — Ambulatory Visit: Payer: Self-pay | Admitting: Urology

## 2017-07-05 ENCOUNTER — Ambulatory Visit
Admission: RE | Admit: 2017-07-05 | Discharge: 2017-07-05 | Disposition: A | Discharge status: Home / Self Care | Payer: 59 | Source: Ambulatory Visit | Attending: Obstetrics & Gynecology | Admitting: Obstetrics & Gynecology

## 2017-07-05 DIAGNOSIS — Z01419 Encounter for gynecological examination (general) (routine) without abnormal findings: Secondary | ICD-10-CM

## 2017-07-05 DIAGNOSIS — Z1231 Encounter for screening mammogram for malignant neoplasm of breast: Secondary | ICD-10-CM | POA: Diagnosis not present

## 2017-07-10 ENCOUNTER — Ambulatory Visit (INDEPENDENT_AMBULATORY_CARE_PROVIDER_SITE_OTHER): Payer: 59 | Admitting: Urology

## 2017-07-10 ENCOUNTER — Encounter: Payer: Self-pay | Admitting: Urology

## 2017-07-10 VITALS — BP 127/86 | HR 103 | Ht 66.5 in | Wt 147.0 lb

## 2017-07-10 DIAGNOSIS — N3946 Mixed incontinence: Secondary | ICD-10-CM | POA: Diagnosis not present

## 2017-07-10 DIAGNOSIS — R32 Unspecified urinary incontinence: Secondary | ICD-10-CM | POA: Diagnosis not present

## 2017-07-10 LAB — URINALYSIS, COMPLETE
BILIRUBIN UA: NEGATIVE
GLUCOSE, UA: NEGATIVE
Ketones, UA: NEGATIVE
Leukocytes, UA: NEGATIVE
Nitrite, UA: NEGATIVE
PROTEIN UA: NEGATIVE
SPEC GRAV UA: 1.015 (ref 1.005–1.030)
UUROB: 0.2 mg/dL (ref 0.2–1.0)
pH, UA: 6 (ref 5.0–7.5)

## 2017-07-10 LAB — MICROSCOPIC EXAMINATION
Bacteria, UA: NONE SEEN
Epithelial Cells (non renal): NONE SEEN /hpf (ref 0–10)
WBC UA: NONE SEEN /HPF (ref 0–?)

## 2017-07-10 LAB — BLADDER SCAN AMB NON-IMAGING: SCAN RESULT: 141

## 2017-07-10 NOTE — Progress Notes (Signed)
07/10/2017 3:51 PM   Wendy Myers [birth year] [id number]  Referring provider: McLean-Scocuzza, Nino Glow, MD [place], [place] [address]  Chief Complaint  Patient presents with  . Urinary Incontinence    HPI: I was consulted to assess the patient's stress urinary incontinence worsening over a few years.  She works locally in [employer].  She leaks with coughing and sometimes sneezing.  She leaks on a trampoline.  She wears 1 pad a day that is damp.  She might have a very rare urge incontinence and no bedwetting  She voids every 2-4 hours and does not have nocturia  She denies a history of kidney stones previous GU surgery and urinary tract infections.  She has not had a hysterectomy.  She has no neurologic issues.  Bowel movements are normal.  Presentation is not been medically treated  Modifying factors: There are no other modifying factors  Associated signs and symptoms: There are no other associated signs and symptoms Aggravating and relieving factors: There are no other aggravating or relieving factors Severity: Moderate Duration: Persistent   PMH: Past Medical History:  Diagnosis Date  . Endometriosis    with removal left fallopian tube and ovary age 14   . Facial basal cell cancer    removed 5/12 Dr. Nehemiah Massed   . Plantar fasciitis of right foot   . Tuberculosis    postitive TB skin test age [age]     Surgical History: Past Surgical History:  Procedure Laterality Date  . LEFT OOPHORECTOMY    . OTHER SURGICAL HISTORY     left fallopian tube removal   . OVARY SURGERY  1996   left ovary and fallopian / endometruim  . WISDOM TOOTH EXTRACTION      x4    Home Medications:  Allergies as of 07/10/2017      Reactions   Penicillins Hives      Medication List        Accurate as of 07/10/17  3:51 PM. Always use your most recent med list.          multivitamin capsule Take 1 capsule by mouth daily.       Allergies:  Allergies    Allergen Reactions  . Penicillins Hives    Family History: Family History  Problem Relation Age of Onset  . Dementia Maternal Grandmother   . Heart disease Paternal Grandfather 49       CHF  . Heart disease Maternal Grandfather        MI  . Bladder Cancer Neg Hx   . Kidney cancer Neg Hx     Social History:  reports that  has never smoked. she has never used smokeless tobacco. She reports that she does not drink alcohol or use drugs.  ROS: UROLOGY Frequent Urination?: No Hard to postpone urination?: No Burning/pain with urination?: No Get up at night to urinate?: No Leakage of urine?: Yes Urine stream starts and stops?: No Trouble starting stream?: No Do you have to strain to urinate?: No Blood in urine?: No Urinary tract infection?: No Sexually transmitted disease?: No Injury to kidneys or bladder?: No Painful intercourse?: No Weak stream?: No Currently pregnant?: No Vaginal bleeding?: No Last menstrual period?: n  Gastrointestinal Nausea?: No Vomiting?: No Indigestion/heartburn?: No Diarrhea?: No Constipation?: No  Constitutional Fever: No Night sweats?: No Weight loss?: No Fatigue?: No  Skin Skin rash/lesions?: No Itching?: No  Eyes Blurred vision?: No Double vision?: No  Ears/Nose/Throat Sore throat?: No Sinus problems?:  No  Hematologic/Lymphatic Swollen glands?: No Easy bruising?: No  Cardiovascular Leg swelling?: No Chest pain?: No  Respiratory Cough?: No Shortness of breath?: No  Endocrine Excessive thirst?: No  Musculoskeletal Back pain?: No Joint pain?: No  Neurological Headaches?: No Dizziness?: No  Psychologic Depression?: No Anxiety?: No  Physical Exam: BP 127/86 (BP Location: Right Arm, Patient Position: Sitting, Cuff Size: Normal)   Pulse (!) 103   Ht 5' 6.5" (1.689 m)   Wt 147 lb (66.7 kg)   BMI 23.37 kg/m   Constitutional:  Alert and oriented, No acute distress. HEENT: [place] AT, moist mucus membranes.   Trachea midline, no masses. Cardiovascular: No clubbing, cyanosis, or edema. Respiratory: Normal respiratory effort, no increased work of breathing. GI: Abdomen is soft, nontender, nondistended, no abdominal masses GU: Grade 1 hypermobility of the bladder neck no stress incontinence or prolapse Skin: No rashes, bruises or suspicious lesions. Lymph: No cervical or inguinal adenopathy. Neurologic: Grossly intact, no focal deficits, moving all 4 extremities. Psychiatric: Normal mood and affect.  Laboratory Data: Lab Results  Component Value Date   WBC 4.4 07/14/2015   HGB 14.8 07/14/2015   HCT 42.3 07/14/2015   MCV 83.4 07/14/2015   PLT 244 07/14/2015    Lab Results  Component Value Date   CREATININE 0.65 07/14/2015    No results found for: PSA  No results found for: TESTOSTERONE  No results found for: HGBA1C  Urinalysis No results found for: COLORURINE, APPEARANCEUR, LABSPEC, PHURINE, GLUCOSEU, HGBUR, BILIRUBINUR, KETONESUR, PROTEINUR, UROBILINOGEN, NITRITE, LEUKOCYTESUR  Pertinent Imaging: none  Assessment & Plan:   The patient has mild stress incontinence and may be rare urge incontinence.  She has normal frequency.  She is postmenopausal at age 52 and therefore cannot go in 1 of our future studies.  The role of physical therapy and surgery and urodynamics discussed  Physical therapy consultation given.  I will see her as needed.  No urodynamics ordered  There are no diagnoses linked to this encounter.  No Follow-up on file.  Reece Packer, MD  [facility] [address], [place], [place] [address] [phone]

## 2017-07-20 ENCOUNTER — Other Ambulatory Visit: Payer: 59

## 2017-08-01 DIAGNOSIS — L308 Other specified dermatitis: Secondary | ICD-10-CM | POA: Diagnosis not present

## 2017-08-04 ENCOUNTER — Telehealth: Payer: Self-pay | Admitting: Urology

## 2017-08-04 NOTE — Telephone Encounter (Signed)
Luanne Bras called this patient 4 times to schedule her PT in [place] and the patient never returned her call to schedule.  Sharyn Lull

## 2017-11-10 DIAGNOSIS — L309 Dermatitis, unspecified: Secondary | ICD-10-CM | POA: Diagnosis not present

## 2018-05-01 ENCOUNTER — Encounter: Payer: Self-pay | Admitting: Radiology

## 2018-05-11 ENCOUNTER — Encounter: Payer: Self-pay | Admitting: Family Medicine

## 2018-05-11 ENCOUNTER — Ambulatory Visit (INDEPENDENT_AMBULATORY_CARE_PROVIDER_SITE_OTHER): Payer: 59 | Admitting: Family Medicine

## 2018-05-11 ENCOUNTER — Ambulatory Visit: Payer: 59 | Admitting: Family Medicine

## 2018-05-11 VITALS — BP 110/78 | HR 88 | Temp 98.2°F | Ht 68.0 in | Wt 148.0 lb

## 2018-05-11 DIAGNOSIS — R0982 Postnasal drip: Secondary | ICD-10-CM

## 2018-05-11 DIAGNOSIS — R070 Pain in throat: Secondary | ICD-10-CM | POA: Diagnosis not present

## 2018-05-11 LAB — POCT RAPID STREP A (OFFICE): RAPID STREP A SCREEN: NEGATIVE

## 2018-05-11 MED ORDER — PREDNISONE 10 MG PO TABS: 10.0000 mg | ORAL_TABLET | Freq: Every day | ORAL | 0 refills | Status: AC

## 2018-05-11 NOTE — Progress Notes (Signed)
Subjective:    Patient ID: Wendy Myers, female    DOB: [birth year], 52 y.o.   MRN: [id number]  HPI  Presents to clinic c/o sore and scratchy throat for 2 weeks. Currently she does not take any allergy medication.  Denies any fever or chills.  Denies nausea, vomiting or diarrhea.   Patient states her teenagers have had a scratchy throats and some congestion, but her symptoms seem to resolve.  Past Medical History:  Diagnosis Date  . Endometriosis    with removal left fallopian tube and ovary age 56   . Facial basal cell cancer    removed 5/12 Dr. Nehemiah Massed   . Plantar fasciitis of right foot   . Tuberculosis    postitive TB skin test age [age]     Social History   Tobacco Use  . Smoking status: Never Smoker  . Smokeless tobacco: Never Used  Substance Use Topics  . Alcohol use: No   Review of Systems   Constitutional: Negative for chills, fatigue and fever.  HENT: +scratchy, sore throat Eyes: Negative.   Respiratory: Negative for cough, shortness of breath and wheezing.   Cardiovascular: Negative for chest pain, palpitations and leg swelling.  Gastrointestinal: Negative for abdominal pain, diarrhea, nausea and vomiting.  Genitourinary: Negative for dysuria, frequency and urgency.  Musculoskeletal: Negative for arthralgias and myalgias.  Skin: Negative for color change, pallor and rash.  Neurological: Negative for syncope, light-headedness and headaches.  Psychiatric/Behavioral: The patient is not nervous/anxious.       Objective:   Physical Exam  Constitutional: She is oriented to person, place, and time.  Non-toxic appearance. She does not appear ill.  HENT:  Head: Normocephalic and atraumatic.  Right Ear: Tympanic membrane and ear canal normal.  Left Ear: Tympanic membrane and ear canal normal.  Mouth/Throat: Uvula is midline and mucous membranes are normal. Mucous membranes are not pale. No tonsillar abscesses. No tonsillar exudate.  +mild post nasal drip    Eyes: Pupils are equal, round, and reactive to light. EOM are normal.  Neck: Neck supple.    Soreness on left side of neck with palpation. No obvious swollen gland or lymphadenopathy.   Cardiovascular: Normal rate and normal heart sounds.  Pulmonary/Chest: Effort normal and breath sounds normal. No respiratory distress.  Neurological: She is alert and oriented to person, place, and time.  Skin: Skin is warm and dry. No pallor.  Psychiatric: She has a normal mood and affect. Her behavior is normal.  Nursing note and vitals reviewed.     Vitals:   05/11/18 1452  BP: 110/78  Pulse: 88  Temp: 98.2 F (36.8 C)  SpO2: 97%   Assessment & Plan:   Pain in throat- rapid strep negative in clinic.  We will send throat culture to lab to look for any bacterial growth.  In the meantime patient will take prednisone 10 mg once a day for 5 days.  If throat culture does not come back with any abnormal growth, and pain in throat continues to persist we discussed possibility of doing an ENT referral.  Postnasal drip- postnasal drip present on exam, patient will do Claritin once daily to help improve postnasal drip symptoms.  Patient does not require a prescription for this states she has some at home.  Keep regularly scheduled follow-up with PCP as planned.  Return to clinic sooner if new issues arise or if current symptoms persist or worsen.  We will contact you with results of throat culture.

## 2018-05-13 LAB — CULTURE, UPPER RESPIRATORY
MICRO NUMBER:: 91411462
SPECIMEN QUALITY:: ADEQUATE

## 2018-12-06 DIAGNOSIS — L988 Other specified disorders of the skin and subcutaneous tissue: Secondary | ICD-10-CM | POA: Diagnosis not present

## 2018-12-26 ENCOUNTER — Telehealth: Payer: Self-pay

## 2018-12-26 NOTE — Telephone Encounter (Signed)
Copied from [place] [id number]. Topic: Appointment Scheduling - Scheduling Inquiry for Clinic >> Dec 26, 2018  4:33 PM Oneta Rack wrote: Patient would like to Wendy Myers physical appointment please call [phone]

## 2019-01-09 ENCOUNTER — Encounter: Payer: 59 | Admitting: Internal Medicine

## 2019-02-14 ENCOUNTER — Other Ambulatory Visit: Payer: Self-pay

## 2019-02-14 ENCOUNTER — Ambulatory Visit (INDEPENDENT_AMBULATORY_CARE_PROVIDER_SITE_OTHER): Payer: 59 | Admitting: Internal Medicine

## 2019-02-14 VITALS — BP 106/68 | HR 85 | Temp 98.1°F | Resp 16 | Ht 68.0 in | Wt 144.0 lb

## 2019-02-14 DIAGNOSIS — E559 Vitamin D deficiency, unspecified: Secondary | ICD-10-CM | POA: Diagnosis not present

## 2019-02-14 DIAGNOSIS — Z0000 Encounter for general adult medical examination without abnormal findings: Secondary | ICD-10-CM | POA: Diagnosis not present

## 2019-02-14 DIAGNOSIS — Z1231 Encounter for screening mammogram for malignant neoplasm of breast: Secondary | ICD-10-CM | POA: Diagnosis not present

## 2019-02-14 DIAGNOSIS — Z1329 Encounter for screening for other suspected endocrine disorder: Secondary | ICD-10-CM

## 2019-02-14 DIAGNOSIS — Z13220 Encounter for screening for lipoid disorders: Secondary | ICD-10-CM | POA: Diagnosis not present

## 2019-02-14 DIAGNOSIS — Z1159 Encounter for screening for other viral diseases: Secondary | ICD-10-CM

## 2019-02-14 DIAGNOSIS — Z1389 Encounter for screening for other disorder: Secondary | ICD-10-CM

## 2019-02-14 NOTE — Progress Notes (Signed)
Chief Complaint  Patient presents with  . Annual Exam   Annual no complaints  Exercising 3 miles 3x per week since 06/2018   Review of Systems  Constitutional: Negative for weight loss.  HENT: Negative for hearing loss.   Eyes: Negative for blurred vision.  Respiratory: Negative for shortness of breath.   Cardiovascular: Negative for chest pain.  Gastrointestinal: Negative for abdominal pain.  Genitourinary: Negative for dysuria.  Musculoskeletal: Negative for falls.  Skin: Negative for rash.  Neurological: Negative for headaches.  Psychiatric/Behavioral: Negative for depression.   Past Medical History:  Diagnosis Date  . Endometriosis    with removal left fallopian tube and ovary age 51   . Facial basal cell cancer    removed 5/12 Dr. Nehemiah Massed   . Plantar fasciitis of right foot   . Tuberculosis    postitive TB skin test age [age]    Past Surgical History:  Procedure Laterality Date  . LEFT OOPHORECTOMY    . OTHER SURGICAL HISTORY     left fallopian tube removal   . OVARY SURGERY  1996   left ovary and fallopian / endometruim  . WISDOM TOOTH EXTRACTION      x4   Family History  Problem Relation Age of Onset  . Dementia Maternal Grandmother   . Heart disease Paternal Grandfather 87       CHF  . Heart disease Maternal Grandfather        MI  . Bladder Cancer Neg Hx   . Kidney cancer Neg Hx    Social History   Socioeconomic History  . Marital status: Married    Spouse name: Not on file  . Number of children: Not on file  . Years of education: Not on file  . Highest education level: Not on file  Occupational History  . Not on file  Social Needs  . Financial resource strain: Not on file  . Food insecurity    Worry: Not on file    Inability: Not on file  . Transportation needs    Medical: Not on file    Non-medical: Not on file  Tobacco Use  . Smoking status: Never Smoker  . Smokeless tobacco: Never Used  Substance and Sexual Activity  . Alcohol use:  No  . Drug use: No  . Sexual activity: Yes    Partners: Male    Birth control/protection: I.U.D.  Lifestyle  . Physical activity    Days per week: Not on file    Minutes per session: Not on file  . Stress: Not on file  Relationships  . Social Herbalist on phone: Not on file    Gets together: Not on file    Attends religious service: Not on file    Active member of club or organization: Not on file    Attends meetings of clubs or organizations: Not on file    Relationship status: Not on file  . Intimate partner violence    Fear of current or ex partner: Not on file    Emotionally abused: Not on file    Physically abused: Not on file    Forced sexual activity: Not on file  Other Topics Concern  . Not on file  Social History Narrative   Masters in [employer] to [employer]    Married    2 kids    Current Meds  Medication Sig  . Multiple Vitamin (MULTIVITAMIN) capsule Take 1 capsule by mouth daily.  Allergies  Allergen Reactions  . Penicillins Hives   No results found for this or any previous visit (from the past 2160 hour(s)). Objective  Body mass index is 21.9 kg/m. Wt Readings from Last 3 Encounters:  02/14/19 144 lb (65.3 kg)  05/11/18 148 lb (67.1 kg)  07/10/17 147 lb (66.7 kg)   Temp Readings from Last 3 Encounters:  02/14/19 98.1 F (36.7 C)  05/11/18 98.2 F (36.8 C) (Oral)  06/15/17 98.8 F (37.1 C) (Oral)   BP Readings from Last 3 Encounters:  02/14/19 106/68  05/11/18 110/78  07/10/17 127/86   Pulse Readings from Last 3 Encounters:  02/14/19 85  05/11/18 88  07/10/17 (!) 103    Physical Exam Vitals signs and nursing note reviewed.  Constitutional:      Appearance: Normal appearance. She is well-developed and well-groomed.  HENT:     Head: Normocephalic and atraumatic.     Comments: +mask on   Cardiovascular:     Rate and Rhythm: Normal rate and regular rhythm.     Heart sounds: Normal heart sounds.  Pulmonary:     Effort:  Pulmonary effort is normal.     Breath sounds: Normal breath sounds.  Chest:     Breasts: Breasts are symmetrical.        Right: Normal. No swelling, bleeding, inverted nipple, mass, nipple discharge, skin change or tenderness.        Left: Normal. No swelling, bleeding, inverted nipple, mass, nipple discharge, skin change or tenderness.  Abdominal:     Tenderness: There is no abdominal tenderness.  Lymphadenopathy:     Upper Body:     Right upper body: No axillary adenopathy.     Left upper body: No axillary adenopathy.  Skin:    General: Skin is warm and dry.  Neurological:     General: No focal deficit present.     Mental Status: She is alert and oriented to person, place, and time. Mental status is at baseline.     Gait: Gait normal.  Psychiatric:        Attention and Perception: Attention and perception normal.        Mood and Affect: Mood and affect normal.        Speech: Speech normal.        Behavior: Behavior normal. Behavior is cooperative.        Thought Content: Thought content normal.        Cognition and Memory: Cognition and memory normal.        Judgment: Judgment normal.     Assessment   1. annual Plan  Will get flu shot at work shingrix will come back for this  Disc Tdap in future   Pap 06/09/17 neg, neg hpv ob/gyn  mammo sch 06/2017 negative will repeat referred  Pt does not want to do colonoscopy now given info on cologaurd to call insurance for price again today will get back with me F/u dermatology Brenda 2020  sch fasting labs   Declines STD check  Cont healthy exercise   Provider: Dr. Olivia Mackie McLean-Scocuzza-Internal Medicine

## 2019-02-14 NOTE — Patient Instructions (Signed)

## 2019-02-14 NOTE — Addendum Note (Signed)
Addended by: Orland Mustard on: 02/14/2019 05:21 PM   Modules accepted: Orders

## 2019-03-06 ENCOUNTER — Other Ambulatory Visit: Payer: Self-pay

## 2019-03-06 ENCOUNTER — Other Ambulatory Visit (INDEPENDENT_AMBULATORY_CARE_PROVIDER_SITE_OTHER): Payer: 59

## 2019-03-06 DIAGNOSIS — Z1389 Encounter for screening for other disorder: Secondary | ICD-10-CM

## 2019-03-06 DIAGNOSIS — E559 Vitamin D deficiency, unspecified: Secondary | ICD-10-CM | POA: Diagnosis not present

## 2019-03-06 DIAGNOSIS — Z0000 Encounter for general adult medical examination without abnormal findings: Secondary | ICD-10-CM | POA: Diagnosis not present

## 2019-03-06 DIAGNOSIS — Z1329 Encounter for screening for other suspected endocrine disorder: Secondary | ICD-10-CM

## 2019-03-06 DIAGNOSIS — Z1159 Encounter for screening for other viral diseases: Secondary | ICD-10-CM

## 2019-03-06 DIAGNOSIS — Z13220 Encounter for screening for lipoid disorders: Secondary | ICD-10-CM | POA: Diagnosis not present

## 2019-03-06 LAB — COMPREHENSIVE METABOLIC PANEL
ALT: 11 U/L (ref 0–35)
AST: 15 U/L (ref 0–37)
Albumin: 4.5 g/dL (ref 3.5–5.2)
Alkaline Phosphatase: 77 U/L (ref 39–117)
BUN: 16 mg/dL (ref 6–23)
CO2: 30 mEq/L (ref 19–32)
Calcium: 9.5 mg/dL (ref 8.4–10.5)
Chloride: 101 mEq/L (ref 96–112)
Creatinine, Ser: 0.65 mg/dL (ref 0.40–1.20)
GFR: 94.77 mL/min (ref >60.00)
Glucose, Bld: 100 mg/dL — ABNORMAL HIGH (ref 70–99)
Potassium: 3.4 mEq/L — ABNORMAL LOW (ref 3.5–5.1)
Sodium: 140 mEq/L (ref 135–145)
Total Bilirubin: 0.7 mg/dL (ref 0.2–1.2)
Total Protein: 6.6 g/dL (ref 6.0–8.3)

## 2019-03-06 LAB — CBC WITH DIFFERENTIAL/PLATELET
Basophils Absolute: 0 10*3/uL (ref 0.0–0.1)
Basophils Relative: 0.5 % (ref 0.0–3.0)
Eosinophils Absolute: 0.1 10*3/uL (ref 0.0–0.7)
Eosinophils Relative: 2.1 % (ref 0.0–5.0)
HCT: 42.9 % (ref 36.0–46.0)
Hemoglobin: 14.5 g/dL (ref 12.0–15.0)
Lymphocytes Relative: 39.5 % (ref 12.0–46.0)
Lymphs Abs: 1.9 10*3/uL (ref 0.7–4.0)
MCHC: 33.8 g/dL (ref 30.0–36.0)
MCV: 83.7 fl (ref 78.0–100.0)
Monocytes Absolute: 0.3 10*3/uL (ref 0.1–1.0)
Monocytes Relative: 7.1 % (ref 3.0–12.0)
Neutro Abs: 2.5 10*3/uL (ref 1.4–7.7)
Neutrophils Relative %: 50.8 % (ref 43.0–77.0)
Platelets: 301 10*3/uL (ref 150.0–400.0)
RBC: 5.12 Mil/uL — ABNORMAL HIGH (ref 3.87–5.11)
RDW: 12.6 % (ref 11.5–15.5)
WBC: 4.8 10*3/uL (ref 4.0–10.5)

## 2019-03-06 LAB — VITAMIN D 25 HYDROXY (VIT D DEFICIENCY, FRACTURES): VITD: 42.34 ng/mL (ref 30.00–100.00)

## 2019-03-06 LAB — TSH: TSH: 1.98 u[IU]/mL (ref 0.35–4.50)

## 2019-03-06 LAB — LIPID PANEL
Cholesterol: 164 mg/dL (ref 0–200)
HDL: 70.5 mg/dL (ref >39.00)
LDL Cholesterol: 80 mg/dL (ref 0–99)
NonHDL: 93.01
Total CHOL/HDL Ratio: 2
Triglycerides: 66 mg/dL (ref 0.0–149.0)
VLDL: 13.2 mg/dL (ref 0.0–40.0)

## 2019-03-07 ENCOUNTER — Telehealth: Payer: Self-pay

## 2019-03-07 ENCOUNTER — Ambulatory Visit (INDEPENDENT_AMBULATORY_CARE_PROVIDER_SITE_OTHER): Payer: 59

## 2019-03-07 ENCOUNTER — Other Ambulatory Visit: Payer: Self-pay

## 2019-03-07 DIAGNOSIS — Z23 Encounter for immunization: Secondary | ICD-10-CM

## 2019-03-07 LAB — URINALYSIS, ROUTINE W REFLEX MICROSCOPIC
Bilirubin Urine: NEGATIVE
Glucose, UA: NEGATIVE
Hgb urine dipstick: NEGATIVE
Ketones, ur: NEGATIVE
Leukocytes,Ua: NEGATIVE
Nitrite: NEGATIVE
Protein, ur: NEGATIVE
Specific Gravity, Urine: 1.011 (ref 1.001–1.03)
pH: 7 (ref 5.0–8.0)

## 2019-03-07 LAB — HEPATITIS B SURFACE ANTIBODY, QUANTITATIVE: Hep B S AB Quant (Post): 117 m[IU]/mL (ref >=10)

## 2019-03-07 NOTE — Telephone Encounter (Signed)
Patient presented today for shingles vaccination and wanted to know if she could come back at a later date to get a Tdap (tetanus) shot.  Patient said that it's been over 20 years since she last had one.  No Tdap vaccination is listed in patient's chart.  Please advise if ok for patient to get vaccination.

## 2019-03-07 NOTE — Telephone Encounter (Signed)
Please log shingrix if she had this   tdap can have in 1 month after shingrix   Will need 2nd dose shingrix in 2-6 months  [place]

## 2019-03-08 ENCOUNTER — Encounter: Payer: Self-pay | Admitting: *Deleted

## 2019-03-08 NOTE — Telephone Encounter (Signed)
Shingrix has been logged.  Called patient to inform about PCP's recommendation of Tdap.  No answer.  LMTCB.  Sent patient a message on MyChart.

## 2019-07-04 ENCOUNTER — Ambulatory Visit: Payer: 59

## 2019-08-22 ENCOUNTER — Ambulatory Visit (INDEPENDENT_AMBULATORY_CARE_PROVIDER_SITE_OTHER): Payer: 59

## 2019-08-22 ENCOUNTER — Other Ambulatory Visit: Payer: Self-pay

## 2019-08-22 DIAGNOSIS — Z23 Encounter for immunization: Secondary | ICD-10-CM

## 2019-08-22 NOTE — Progress Notes (Addendum)
Patient presented for Shingrix injection to right deltoid, patient voiced no concerns nor showed any signs of distress during injection.  Reviewed.  Dr Nicki Reaper

## 2019-09-19 ENCOUNTER — Ambulatory Visit: Payer: 59

## 2019-12-17 ENCOUNTER — Encounter: Payer: Self-pay | Admitting: Family Medicine

## 2019-12-17 ENCOUNTER — Other Ambulatory Visit: Payer: Self-pay

## 2019-12-17 ENCOUNTER — Ambulatory Visit (INDEPENDENT_AMBULATORY_CARE_PROVIDER_SITE_OTHER): Payer: 59 | Admitting: Family Medicine

## 2019-12-17 ENCOUNTER — Other Ambulatory Visit (HOSPITAL_COMMUNITY)
Admission: RE | Admit: 2019-12-17 | Discharge: 2019-12-17 | Disposition: A | Discharge status: Home / Self Care | Payer: 59 | Source: Ambulatory Visit | Attending: Family Medicine | Admitting: Family Medicine

## 2019-12-17 VITALS — BP 129/84 | HR 84 | Ht 68.0 in | Wt 143.0 lb

## 2019-12-17 DIAGNOSIS — Z23 Encounter for immunization: Secondary | ICD-10-CM | POA: Diagnosis not present

## 2019-12-17 DIAGNOSIS — Z124 Encounter for screening for malignant neoplasm of cervix: Secondary | ICD-10-CM | POA: Insufficient documentation

## 2019-12-17 DIAGNOSIS — Z01419 Encounter for gynecological examination (general) (routine) without abnormal findings: Secondary | ICD-10-CM | POA: Diagnosis not present

## 2019-12-17 DIAGNOSIS — R102 Pelvic and perineal pain: Secondary | ICD-10-CM | POA: Diagnosis not present

## 2019-12-17 NOTE — Patient Instructions (Signed)

## 2019-12-17 NOTE — Progress Notes (Signed)
  Subjective:     Wendy Myers is a 54 y.o. female and is here for a comprehensive physical exam. The patient reports problems - pelvic pain. Right sided pain which is an irritant since 2017. IUD removed at that time. Normal u/s. Pain improved then. Pain is worse at work, or under stress, not related to food or bowels. Notes radiation to upper abdomen.  Menopausal for quite some time.    The following portions of the patient's history were reviewed and updated as appropriate: allergies, current medications, past family history, past medical history, past social history, past surgical history and problem list.  Review of Systems Pertinent items noted in HPI and remainder of comprehensive ROS otherwise negative.   Objective:    BP 129/84   Pulse 84   Ht 5\' 8"  (1.727 m)   Wt 143 lb (64.9 kg)   BMI 21.74 kg/m  General appearance: alert, cooperative and appears stated age Head: Normocephalic, without obvious abnormality, atraumatic Neck: no adenopathy, supple, symmetrical, trachea midline and thyroid not enlarged, symmetric, no tenderness/mass/nodules Lungs: clear to auscultation bilaterally Breasts: normal appearance, no masses or tenderness Heart: regular rate and rhythm, S1, S2 normal, no murmur, click, rub or gallop Abdomen: soft, non-tender; bowel sounds normal; no masses,  no organomegaly Pelvic: cervix normal in appearance, external genitalia normal, no cervical motion tenderness, uterus normal size, shape, and consistency, vagina normal without discharge and absent left adnexa, right ovary without mass, + TTP Extremities: extremities normal, atraumatic, no cyanosis or edema Pulses: 2+ and symmetric Skin: Skin color, texture, turgor normal. No rashes or lesions Lymph nodes: Cervical, supraclavicular, and axillary nodes normal. Neurologic: Grossly normal    Assessment:    Healthy female exam.      Plan:      Problem List Items Addressed This Visit      Unprioritized    Pelvic pain - Primary    Unclear etiology, had h/o endometriosis, ? Scar tissue as she is now menopausal. Reproducible when holding her ovary. Check pelvic sonogram.      Relevant Orders   US PELVIC COMPLETE WITH TRANSVAGINAL    Other Visit Diagnoses    Screening for malignant neoplasm of cervix       Relevant Orders   Cytology - PAP   Encounter for gynecological examination without abnormal finding       Relevant Orders   MM 3D SCREEN BREAST BILATERAL     Return in 1 year (on 12/16/2020).  See After Visit Summary for Counseling Recommendations

## 2019-12-17 NOTE — Assessment & Plan Note (Signed)
Unclear etiology, had h/o endometriosis, ? Scar tissue as she is now menopausal. Reproducible when holding her ovary. Check pelvic sonogram.

## 2019-12-17 NOTE — Progress Notes (Signed)
Right sided pelvic pain is back that she had back in 2017 when she saw Dr Hulan Fray. Pt states she feels it more when she is stressed.

## 2019-12-19 LAB — CYTOLOGY - PAP
Comment: NEGATIVE
Diagnosis: NEGATIVE
High risk HPV: NEGATIVE

## 2019-12-24 ENCOUNTER — Other Ambulatory Visit: Payer: Self-pay

## 2019-12-24 ENCOUNTER — Ambulatory Visit
Admission: RE | Admit: 2019-12-24 | Discharge: 2019-12-24 | Disposition: A | Discharge status: Home / Self Care | Payer: 59 | Source: Ambulatory Visit | Attending: Family Medicine | Admitting: Family Medicine

## 2019-12-24 DIAGNOSIS — Z01419 Encounter for gynecological examination (general) (routine) without abnormal findings: Secondary | ICD-10-CM

## 2019-12-24 DIAGNOSIS — Z1231 Encounter for screening mammogram for malignant neoplasm of breast: Secondary | ICD-10-CM | POA: Diagnosis not present

## 2019-12-31 ENCOUNTER — Ambulatory Visit
Admission: RE | Admit: 2019-12-31 | Discharge: 2019-12-31 | Disposition: A | Discharge status: Home / Self Care | Payer: 59 | Source: Ambulatory Visit | Attending: Family Medicine | Admitting: Family Medicine

## 2019-12-31 DIAGNOSIS — D251 Intramural leiomyoma of uterus: Secondary | ICD-10-CM | POA: Diagnosis not present

## 2019-12-31 DIAGNOSIS — R102 Pelvic and perineal pain: Secondary | ICD-10-CM

## 2020-10-14 ENCOUNTER — Encounter: Payer: Self-pay | Admitting: Radiology

## 2020-11-24 ENCOUNTER — Other Ambulatory Visit: Payer: Self-pay | Admitting: Family Medicine

## 2020-11-24 DIAGNOSIS — Z1231 Encounter for screening mammogram for malignant neoplasm of breast: Secondary | ICD-10-CM

## 2020-12-01 ENCOUNTER — Other Ambulatory Visit: Payer: Self-pay

## 2020-12-01 ENCOUNTER — Encounter: Payer: Self-pay | Admitting: Internal Medicine

## 2020-12-01 ENCOUNTER — Ambulatory Visit (INDEPENDENT_AMBULATORY_CARE_PROVIDER_SITE_OTHER): Payer: 59 | Admitting: Internal Medicine

## 2020-12-01 VITALS — BP 108/78 | HR 82 | Temp 97.6°F | Ht 67.13 in | Wt 151.0 lb

## 2020-12-01 DIAGNOSIS — Z1389 Encounter for screening for other disorder: Secondary | ICD-10-CM

## 2020-12-01 DIAGNOSIS — R1031 Right lower quadrant pain: Secondary | ICD-10-CM | POA: Diagnosis not present

## 2020-12-01 DIAGNOSIS — Z13220 Encounter for screening for lipoid disorders: Secondary | ICD-10-CM | POA: Diagnosis not present

## 2020-12-01 DIAGNOSIS — Z0000 Encounter for general adult medical examination without abnormal findings: Secondary | ICD-10-CM | POA: Diagnosis not present

## 2020-12-01 DIAGNOSIS — Z1329 Encounter for screening for other suspected endocrine disorder: Secondary | ICD-10-CM | POA: Diagnosis not present

## 2020-12-01 DIAGNOSIS — Z1211 Encounter for screening for malignant neoplasm of colon: Secondary | ICD-10-CM | POA: Diagnosis not present

## 2020-12-01 DIAGNOSIS — Z1283 Encounter for screening for malignant neoplasm of skin: Secondary | ICD-10-CM | POA: Diagnosis not present

## 2020-12-01 DIAGNOSIS — Z1231 Encounter for screening mammogram for malignant neoplasm of breast: Secondary | ICD-10-CM

## 2020-12-01 DIAGNOSIS — Z13818 Encounter for screening for other digestive system disorders: Secondary | ICD-10-CM

## 2020-12-01 NOTE — Patient Instructions (Signed)
Message me about CT scan abdomen/pelvis

## 2020-12-01 NOTE — Progress Notes (Signed)
Chief Complaint  Patient presents with   Annual Exam   Annual doing well  1. 1/10 RLQ cramping pelvis US 12/2019 [place] fibroid intermittent cramping    Review of Systems  Constitutional:  Negative for weight loss.  HENT:  Negative for hearing loss.   Eyes:  Negative for blurred vision.  Respiratory:  Negative for shortness of breath.   Cardiovascular:  Negative for chest pain.  Gastrointestinal:  Positive for abdominal pain.  Musculoskeletal:  Negative for falls and joint pain.  Skin:  Negative for rash.  Neurological:  Negative for headaches.  Psychiatric/Behavioral:  Negative for depression.   Past Medical History:  Diagnosis Date   Endometriosis    with removal left fallopian tube and ovary age 83    Facial basal cell cancer    removed 5/12 Dr. Nehemiah Massed    Fibroid    Plantar fasciitis of right foot    Tuberculosis    postitive TB skin test age [age]    Past Surgical History:  Procedure Laterality Date   LEFT OOPHORECTOMY     OTHER SURGICAL HISTORY     left fallopian tube removal    OVARY SURGERY  1996   left ovary and fallopian / endometruim   WISDOM TOOTH EXTRACTION      x4   Family History  Problem Relation Age of Onset   Dementia Maternal Grandmother    Heart disease Paternal Grandfather 20       CHF   Heart disease Maternal Grandfather        MI   Bladder Cancer Neg Hx    Kidney cancer Neg Hx    Social History   Socioeconomic History   Marital status: Married    Spouse name: Not on file   Number of children: Not on file   Years of education: Not on file   Highest education level: Not on file  Occupational History   Not on file  Tobacco Use   Smoking status: Never   Smokeless tobacco: Never  Vaping Use   Vaping Use: Never used  Substance and Sexual Activity   Alcohol use: No   Drug use: No   Sexual activity: Yes    Partners: Male  Other Topics Concern   Not on file  Social History Narrative   Masters in [place] to [employer]    Married    2  kids    Social Determinants of Health   Financial Resource Strain: Not on file  Food Insecurity: Not on file  Transportation Needs: Not on file  Physical Activity: Not on file  Stress: Not on file  Social Connections: Not on file  Intimate Partner Violence: Not on file   Current Meds  Medication Sig   Multiple Vitamin (MULTIVITAMIN) capsule Take 1 capsule by mouth daily.     Allergies  Allergen Reactions   Penicillins Hives   No results found for this or any previous visit (from the past 2160 hour(s)). Objective  Body mass index is 23.56 kg/m. Wt Readings from Last 3 Encounters:  12/01/20 151 lb (68.5 kg)  12/17/19 143 lb (64.9 kg)  02/14/19 144 lb (65.3 kg)   Temp Readings from Last 3 Encounters:  12/01/20 97.6 F (36.4 C) (Oral)  02/14/19 98.1 F (36.7 C)  05/11/18 98.2 F (36.8 C) (Oral)   BP Readings from Last 3 Encounters:  12/01/20 108/78  12/17/19 129/84  02/14/19 106/68   Pulse Readings from Last 3 Encounters:  12/01/20 82  12/17/19 84  02/14/19 85    Physical Exam Vitals and nursing note reviewed.  Constitutional:      Appearance: Normal appearance.  HENT:     Head: Normocephalic and atraumatic.  Eyes:     Conjunctiva/sclera: Conjunctivae normal.     Pupils: Pupils are equal, round, and reactive to light.  Cardiovascular:     Rate and Rhythm: Normal rate and regular rhythm.     Heart sounds: No murmur heard. Pulmonary:     Effort: Pulmonary effort is normal.     Breath sounds: Normal breath sounds.  Abdominal:     General: Abdomen is flat. Bowel sounds are normal.  Skin:    General: Skin is warm and dry.  Neurological:     General: No focal deficit present.     Mental Status: She is alert and oriented to person, place, and time. Mental status is at baseline.  Psychiatric:        Mood and Affect: Mood normal.        Behavior: Behavior normal.        Thought Content: Thought content normal.        Judgment: Judgment normal.     Assessment  Plan  Annual physical exam - Plan: Comprehensive metabolic panel, Lipid panel, CBC with Differential/Platelet, TSH, Urinalysis, Routine w reflex microscopic, Hepatitis C antibody  Encounter for screening colonoscopy - Plan: Ambulatory referral to Gastroenterology  Encounter for screening for other digestive system disorders - Plan: Hepatitis C antibody  Screening mammogram, encounter for  Skin cancer screening - Plan: Ambulatory referral to Dermatology  Right lower quadrant abdominal pain  Consider CT ab/pelvis    Flu utd shingrix 2/2 Tdap utd  [employer] 3/3 Pap 06/09/17 neg, neg hpv ob/gyn 12/17/19 neg neg HPV f/u ob/gyn  mammo sch 12/24/19 negative sch 2022  Referred colonoscopy Leb F/u dermatology Brenda 2020 sch fasting labs   Declines STD check  Cont healthy exercise    Provider: Dr. Olivia Mackie McLean-Scocuzza-[facility]

## 2020-12-02 ENCOUNTER — Other Ambulatory Visit (INDEPENDENT_AMBULATORY_CARE_PROVIDER_SITE_OTHER): Payer: 59

## 2020-12-02 DIAGNOSIS — Z13818 Encounter for screening for other digestive system disorders: Secondary | ICD-10-CM | POA: Diagnosis not present

## 2020-12-02 DIAGNOSIS — Z1389 Encounter for screening for other disorder: Secondary | ICD-10-CM | POA: Diagnosis not present

## 2020-12-02 DIAGNOSIS — Z13220 Encounter for screening for lipoid disorders: Secondary | ICD-10-CM

## 2020-12-02 DIAGNOSIS — Z0000 Encounter for general adult medical examination without abnormal findings: Secondary | ICD-10-CM

## 2020-12-02 DIAGNOSIS — Z1329 Encounter for screening for other suspected endocrine disorder: Secondary | ICD-10-CM

## 2020-12-02 LAB — LIPID PANEL
Cholesterol: 166 mg/dL (ref 0–200)
HDL: 62 mg/dL (ref >39.00)
LDL Cholesterol: 89 mg/dL (ref 0–99)
NonHDL: 103.64
Total CHOL/HDL Ratio: 3
Triglycerides: 72 mg/dL (ref 0.0–149.0)
VLDL: 14.4 mg/dL (ref 0.0–40.0)

## 2020-12-02 LAB — COMPREHENSIVE METABOLIC PANEL
ALT: 14 U/L (ref 0–35)
AST: 13 U/L (ref 0–37)
Albumin: 4.6 g/dL (ref 3.5–5.2)
Alkaline Phosphatase: 61 U/L (ref 39–117)
BUN: 14 mg/dL (ref 6–23)
CO2: 29 mEq/L (ref 19–32)
Calcium: 9 mg/dL (ref 8.4–10.5)
Chloride: 103 mEq/L (ref 96–112)
Creatinine, Ser: 0.63 mg/dL (ref 0.40–1.20)
GFR: 99.22 mL/min (ref >60.00)
Glucose, Bld: 102 mg/dL — ABNORMAL HIGH (ref 70–99)
Potassium: 3.6 mEq/L (ref 3.5–5.1)
Sodium: 140 mEq/L (ref 135–145)
Total Bilirubin: 0.4 mg/dL (ref 0.2–1.2)
Total Protein: 6.8 g/dL (ref 6.0–8.3)

## 2020-12-02 LAB — CBC WITH DIFFERENTIAL/PLATELET
Basophils Absolute: 0 10*3/uL (ref 0.0–0.1)
Basophils Relative: 0.8 % (ref 0.0–3.0)
Eosinophils Absolute: 0.1 10*3/uL (ref 0.0–0.7)
Eosinophils Relative: 2.5 % (ref 0.0–5.0)
HCT: 41.9 % (ref 36.0–46.0)
Hemoglobin: 14.5 g/dL (ref 12.0–15.0)
Lymphocytes Relative: 41.9 % (ref 12.0–46.0)
Lymphs Abs: 1.9 10*3/uL (ref 0.7–4.0)
MCHC: 34.5 g/dL (ref 30.0–36.0)
MCV: 83.6 fl (ref 78.0–100.0)
Monocytes Absolute: 0.3 10*3/uL (ref 0.1–1.0)
Monocytes Relative: 6.8 % (ref 3.0–12.0)
Neutro Abs: 2.2 10*3/uL (ref 1.4–7.7)
Neutrophils Relative %: 48 % (ref 43.0–77.0)
Platelets: 294 10*3/uL (ref 150.0–400.0)
RBC: 5.02 Mil/uL (ref 3.87–5.11)
RDW: 12.9 % (ref 11.5–15.5)
WBC: 4.6 10*3/uL (ref 4.0–10.5)

## 2020-12-02 LAB — TSH: TSH: 1.7 u[IU]/mL (ref 0.35–4.50)

## 2020-12-03 LAB — URINALYSIS, ROUTINE W REFLEX MICROSCOPIC
Bacteria, UA: NONE SEEN /HPF
Bilirubin Urine: NEGATIVE
Glucose, UA: NEGATIVE
Hgb urine dipstick: NEGATIVE
Hyaline Cast: NONE SEEN /LPF
Ketones, ur: NEGATIVE
Nitrite: NEGATIVE
Protein, ur: NEGATIVE
RBC / HPF: NONE SEEN /HPF (ref 0–2)
Specific Gravity, Urine: 1.009 (ref 1.001–1.035)
Squamous Epithelial / HPF: NONE SEEN /HPF (ref <=5)
WBC, UA: NONE SEEN /HPF (ref 0–5)
pH: 7 (ref 5.0–8.0)

## 2020-12-03 LAB — MICROSCOPIC MESSAGE

## 2020-12-03 LAB — HEPATITIS C ANTIBODY
Hepatitis C Ab: NONREACTIVE
SIGNAL TO CUT-OFF: 0 (ref <1.00)

## 2020-12-04 NOTE — Progress Notes (Signed)
Left message for patient to return call back for lab results.

## 2020-12-22 ENCOUNTER — Ambulatory Visit (INDEPENDENT_AMBULATORY_CARE_PROVIDER_SITE_OTHER): Payer: 59 | Admitting: Family Medicine

## 2020-12-22 ENCOUNTER — Other Ambulatory Visit: Payer: Self-pay

## 2020-12-22 ENCOUNTER — Encounter: Payer: Self-pay | Admitting: Family Medicine

## 2020-12-22 VITALS — BP 119/80 | HR 89 | Ht 67.5 in | Wt 150.8 lb

## 2020-12-22 DIAGNOSIS — Z01419 Encounter for gynecological examination (general) (routine) without abnormal findings: Secondary | ICD-10-CM | POA: Diagnosis not present

## 2020-12-22 NOTE — Progress Notes (Signed)
Mammo scheduled 02/02/21 Will schedule colonoscopy in the fall No complaints Declines STI screening

## 2020-12-22 NOTE — Progress Notes (Addendum)
Subjective:     Wendy Myers is a 55 y.o. female and is here for a comprehensive physical exam. The patient reports no problems.    The following portions of the patient's history were reviewed and updated as appropriate: allergies, current medications, past family history, past medical history, past social history, past surgical history, and problem list.  Review of Systems Pertinent items noted in HPI and remainder of comprehensive ROS otherwise negative.   Objective:    BP 119/80   Pulse 89   Ht 5' 7.5" (1.715 m)   Wt 150 lb 12.8 oz (68.4 kg)   BMI 23.27 kg/m  General appearance: alert, cooperative, and appears stated age Head: Normocephalic, without obvious abnormality, atraumatic Neck: no adenopathy, supple, symmetrical, trachea midline, and thyroid not enlarged, symmetric, no tenderness/mass/nodules Lungs: clear to auscultation bilaterally Breasts: normal appearance, no masses or tenderness Heart: regular rate and rhythm, S1, S2 normal, no murmur, click, rub or gallop Abdomen: soft, non-tender; bowel sounds normal; no masses,  no organomegaly Pelvic: cervix normal in appearance, external genitalia normal, no adnexal masses or tenderness, no cervical motion tenderness, uterus normal size, shape, and consistency, and vaginal atrophy Extremities: extremities normal, atraumatic, no cyanosis or edema Pulses: 2+ and symmetric Skin: Skin color, texture, turgor normal. No rashes or lesions Lymph nodes: Cervical, supraclavicular, and axillary nodes normal. Neurologic: Grossly normal    Assessment:    Healthy female exam.      Plan:    Encounter for gynecological examination without abnormal finding Pap is not due Has mammogram scheduled Colonoscopy this fall See After Visit Summary for Counseling Recommendations

## 2021-01-28 DIAGNOSIS — D225 Melanocytic nevi of trunk: Secondary | ICD-10-CM | POA: Diagnosis not present

## 2021-01-28 DIAGNOSIS — D485 Neoplasm of uncertain behavior of skin: Secondary | ICD-10-CM | POA: Diagnosis not present

## 2021-01-28 DIAGNOSIS — D2261 Melanocytic nevi of right upper limb, including shoulder: Secondary | ICD-10-CM | POA: Diagnosis not present

## 2021-01-28 DIAGNOSIS — D2262 Melanocytic nevi of left upper limb, including shoulder: Secondary | ICD-10-CM | POA: Diagnosis not present

## 2021-01-28 DIAGNOSIS — D2271 Melanocytic nevi of right lower limb, including hip: Secondary | ICD-10-CM | POA: Diagnosis not present

## 2021-01-28 DIAGNOSIS — L821 Other seborrheic keratosis: Secondary | ICD-10-CM | POA: Diagnosis not present

## 2021-01-28 DIAGNOSIS — C44319 Basal cell carcinoma of skin of other parts of face: Secondary | ICD-10-CM | POA: Diagnosis not present

## 2021-01-28 DIAGNOSIS — Z08 Encounter for follow-up examination after completed treatment for malignant neoplasm: Secondary | ICD-10-CM | POA: Diagnosis not present

## 2021-01-28 DIAGNOSIS — L308 Other specified dermatitis: Secondary | ICD-10-CM | POA: Diagnosis not present

## 2021-01-28 DIAGNOSIS — Z85828 Personal history of other malignant neoplasm of skin: Secondary | ICD-10-CM | POA: Diagnosis not present

## 2021-01-28 DIAGNOSIS — D2272 Melanocytic nevi of left lower limb, including hip: Secondary | ICD-10-CM | POA: Diagnosis not present

## 2021-02-02 ENCOUNTER — Ambulatory Visit: Payer: 59

## 2021-02-10 ENCOUNTER — Other Ambulatory Visit: Payer: Self-pay

## 2021-02-10 ENCOUNTER — Ambulatory Visit
Admission: RE | Admit: 2021-02-10 | Discharge: 2021-02-10 | Disposition: A | Discharge status: Home / Self Care | Payer: 59 | Source: Ambulatory Visit | Attending: Family Medicine | Admitting: Family Medicine

## 2021-02-10 DIAGNOSIS — Z1231 Encounter for screening mammogram for malignant neoplasm of breast: Secondary | ICD-10-CM

## 2021-04-30 DIAGNOSIS — C44319 Basal cell carcinoma of skin of other parts of face: Secondary | ICD-10-CM | POA: Diagnosis not present

## 2021-12-07 ENCOUNTER — Encounter: Payer: 59 | Admitting: Internal Medicine

## 2021-12-14 ENCOUNTER — Ambulatory Visit (INDEPENDENT_AMBULATORY_CARE_PROVIDER_SITE_OTHER): Payer: 59 | Admitting: Internal Medicine

## 2021-12-14 ENCOUNTER — Encounter: Payer: Self-pay | Admitting: Internal Medicine

## 2021-12-14 VITALS — BP 112/70 | HR 85 | Temp 98.1°F | Resp 14 | Ht 67.5 in | Wt 152.6 lb

## 2021-12-14 DIAGNOSIS — Z1329 Encounter for screening for other suspected endocrine disorder: Secondary | ICD-10-CM

## 2021-12-14 DIAGNOSIS — Z1211 Encounter for screening for malignant neoplasm of colon: Secondary | ICD-10-CM

## 2021-12-14 DIAGNOSIS — Z1389 Encounter for screening for other disorder: Secondary | ICD-10-CM

## 2021-12-14 DIAGNOSIS — Z1231 Encounter for screening mammogram for malignant neoplasm of breast: Secondary | ICD-10-CM | POA: Diagnosis not present

## 2021-12-14 DIAGNOSIS — E559 Vitamin D deficiency, unspecified: Secondary | ICD-10-CM | POA: Diagnosis not present

## 2021-12-14 DIAGNOSIS — Z0000 Encounter for general adult medical examination without abnormal findings: Secondary | ICD-10-CM | POA: Diagnosis not present

## 2021-12-16 ENCOUNTER — Other Ambulatory Visit (INDEPENDENT_AMBULATORY_CARE_PROVIDER_SITE_OTHER): Payer: 59

## 2021-12-16 DIAGNOSIS — Z136 Encounter for screening for cardiovascular disorders: Secondary | ICD-10-CM

## 2021-12-16 DIAGNOSIS — Z1329 Encounter for screening for other suspected endocrine disorder: Secondary | ICD-10-CM

## 2021-12-16 DIAGNOSIS — Z1389 Encounter for screening for other disorder: Secondary | ICD-10-CM | POA: Diagnosis not present

## 2021-12-16 DIAGNOSIS — E559 Vitamin D deficiency, unspecified: Secondary | ICD-10-CM

## 2021-12-16 DIAGNOSIS — Z0000 Encounter for general adult medical examination without abnormal findings: Secondary | ICD-10-CM

## 2021-12-16 LAB — COMPREHENSIVE METABOLIC PANEL
ALT: 10 U/L (ref 0–35)
AST: 14 U/L (ref 0–37)
Albumin: 4.7 g/dL (ref 3.5–5.2)
Alkaline Phosphatase: 61 U/L (ref 39–117)
BUN: 14 mg/dL (ref 6–23)
CO2: 30 mEq/L (ref 19–32)
Calcium: 9.2 mg/dL (ref 8.4–10.5)
Chloride: 103 mEq/L (ref 96–112)
Creatinine, Ser: 0.66 mg/dL (ref 0.40–1.20)
GFR: 97.4 mL/min (ref >60.00)
Glucose, Bld: 100 mg/dL — ABNORMAL HIGH (ref 70–99)
Potassium: 3.5 mEq/L (ref 3.5–5.1)
Sodium: 140 mEq/L (ref 135–145)
Total Bilirubin: 0.7 mg/dL (ref 0.2–1.2)
Total Protein: 6.9 g/dL (ref 6.0–8.3)

## 2021-12-16 LAB — LIPID PANEL
Cholesterol: 179 mg/dL (ref 0–200)
HDL: 65.8 mg/dL (ref >39.00)
LDL Cholesterol: 93 mg/dL (ref 0–99)
NonHDL: 112.85
Total CHOL/HDL Ratio: 3
Triglycerides: 101 mg/dL (ref 0.0–149.0)
VLDL: 20.2 mg/dL (ref 0.0–40.0)

## 2021-12-16 LAB — VITAMIN D 25 HYDROXY (VIT D DEFICIENCY, FRACTURES): VITD: 46.17 ng/mL (ref 30.00–100.00)

## 2021-12-16 LAB — CBC WITH DIFFERENTIAL/PLATELET
Basophils Absolute: 0 10*3/uL (ref 0.0–0.1)
Basophils Relative: 0.9 % (ref 0.0–3.0)
Eosinophils Absolute: 0.1 10*3/uL (ref 0.0–0.7)
Eosinophils Relative: 2.6 % (ref 0.0–5.0)
HCT: 42 % (ref 36.0–46.0)
Hemoglobin: 14.1 g/dL (ref 12.0–15.0)
Lymphocytes Relative: 40.9 % (ref 12.0–46.0)
Lymphs Abs: 1.6 10*3/uL (ref 0.7–4.0)
MCHC: 33.7 g/dL (ref 30.0–36.0)
MCV: 84.3 fl (ref 78.0–100.0)
Monocytes Absolute: 0.3 10*3/uL (ref 0.1–1.0)
Monocytes Relative: 8.4 % (ref 3.0–12.0)
Neutro Abs: 1.9 10*3/uL (ref 1.4–7.7)
Neutrophils Relative %: 47.2 % (ref 43.0–77.0)
Platelets: 280 10*3/uL (ref 150.0–400.0)
RBC: 4.98 Mil/uL (ref 3.87–5.11)
RDW: 12.6 % (ref 11.5–15.5)
WBC: 3.9 10*3/uL — ABNORMAL LOW (ref 4.0–10.5)

## 2021-12-16 LAB — TSH: TSH: 1.9 u[IU]/mL (ref 0.35–5.50)

## 2021-12-17 ENCOUNTER — Telehealth: Payer: Self-pay

## 2021-12-17 LAB — URINALYSIS, ROUTINE W REFLEX MICROSCOPIC
Bacteria, UA: NONE SEEN /HPF
Bilirubin Urine: NEGATIVE
Glucose, UA: NEGATIVE
Hgb urine dipstick: NEGATIVE
Hyaline Cast: NONE SEEN /LPF
Ketones, ur: NEGATIVE
Nitrite: NEGATIVE
Protein, ur: NEGATIVE
RBC / HPF: NONE SEEN /HPF (ref 0–2)
Specific Gravity, Urine: 1.007 (ref 1.001–1.035)
WBC, UA: NONE SEEN /HPF (ref 0–5)
pH: 6.5 (ref 5.0–8.0)

## 2021-12-17 LAB — MICROSCOPIC MESSAGE

## 2021-12-17 NOTE — Telephone Encounter (Signed)
Lvm for pt to return call in regards to lab results.  Per Dr.Tracy: Liver kidneys normal  White blood cell ct slightly low not worried about this her baseline is low  Cholesterol normal  Vitamin D normal  Thyroid normal  Urine ok

## 2022-01-18 ENCOUNTER — Encounter: Payer: Self-pay | Admitting: Internal Medicine

## 2022-11-23 ENCOUNTER — Encounter: Payer: Self-pay | Admitting: Nurse Practitioner

## 2022-11-23 ENCOUNTER — Ambulatory Visit (INDEPENDENT_AMBULATORY_CARE_PROVIDER_SITE_OTHER): Payer: 59 | Admitting: Nurse Practitioner

## 2022-11-23 VITALS — BP 118/78 | HR 85 | Temp 99.0°F | Ht 67.5 in | Wt 155.2 lb

## 2022-11-23 DIAGNOSIS — Z13220 Encounter for screening for lipoid disorders: Secondary | ICD-10-CM

## 2022-11-23 DIAGNOSIS — Z1211 Encounter for screening for malignant neoplasm of colon: Secondary | ICD-10-CM | POA: Diagnosis not present

## 2022-11-23 DIAGNOSIS — Z1329 Encounter for screening for other suspected endocrine disorder: Secondary | ICD-10-CM

## 2022-11-23 DIAGNOSIS — Z780 Asymptomatic menopausal state: Secondary | ICD-10-CM

## 2022-11-23 DIAGNOSIS — Z0000 Encounter for general adult medical examination without abnormal findings: Secondary | ICD-10-CM | POA: Diagnosis not present

## 2022-11-23 DIAGNOSIS — Z1231 Encounter for screening mammogram for malignant neoplasm of breast: Secondary | ICD-10-CM

## 2022-11-23 NOTE — Patient Instructions (Signed)
YOUR MAMMOGRAM IS DUE, PLEASE CALL AND GET THIS SCHEDULED! [facility]  - call [phone]

## 2022-11-23 NOTE — Progress Notes (Signed)
Bethanie Dicker, NP-C Phone: [phone]  Wendy Myers is a 57 y.o. female who presents today for transfer of care and annual exam. She has no complaints or new concerns today. She is not on any medications. She would like to return to complete lab work when fasting.   Diet: Fair- trying to improve, has been traveling recently so more grab & go, quick meals Exercise: [employer] and walking 3 days per week Pap smear: 12/17/2019- Due end of June! Colonoscopy: Never Mammogram: 02/10/2021- Due! Family history-  Colon cancer: No  Breast cancer: No  Ovarian cancer: No Menses: Post menopausal Sexually active: Yes Vaccines-   Flu: UTD  Tetanus: 12/17/2019  Shingles: Completed series  COVID19: x 3 HIV screening: Deferred Hep C Screening: Negative Tobacco use: No Alcohol use: No Illicit Drug use: No Dentist: Yes Ophthalmology: Yes  Social History   Tobacco Use  Smoking Status Never  Smokeless Tobacco Never    Current Outpatient Medications on File Prior to Visit  Medication Sig Dispense Refill   Multiple Vitamin (MULTIVITAMIN) capsule Take 1 capsule by mouth daily.       No current facility-administered medications on file prior to visit.    ROS see history of present illness  Objective  Physical Exam Vitals:   11/23/22 1100  BP: 118/78  Pulse: 85  Temp: 99 F (37.2 C)  SpO2: 95%    BP Readings from Last 3 Encounters:  11/23/22 118/78  12/14/21 112/70  12/22/20 119/80   Wt Readings from Last 3 Encounters:  11/23/22 155 lb 3.2 oz (70.4 kg)  12/14/21 152 lb 9.6 oz (69.2 kg)  12/22/20 150 lb 12.8 oz (68.4 kg)    Physical Exam Constitutional:      General: She is not in acute distress.    Appearance: Normal appearance.  HENT:     Head: Normocephalic.     Right Ear: Tympanic membrane normal.     Left Ear: Tympanic membrane normal.     Nose: Nose normal.     Mouth/Throat:     Mouth: Mucous membranes are moist.     Pharynx: Oropharynx is  clear.  Eyes:     Conjunctiva/sclera: Conjunctivae normal.     Pupils: Pupils are equal, round, and reactive to light.  Neck:     Thyroid: No thyromegaly.  Cardiovascular:     Rate and Rhythm: Normal rate and regular rhythm.     Heart sounds: Normal heart sounds.  Pulmonary:     Effort: Pulmonary effort is normal.     Breath sounds: Normal breath sounds.  Abdominal:     General: Abdomen is flat. Bowel sounds are normal.     Palpations: Abdomen is soft. There is no mass.     Tenderness: There is no abdominal tenderness.  Musculoskeletal:        General: Normal range of motion.  Lymphadenopathy:     Cervical: No cervical adenopathy.  Skin:    General: Skin is warm and dry.     Findings: No rash.  Neurological:     General: No focal deficit present.     Mental Status: She is alert.  Psychiatric:        Mood and Affect: Mood normal.        Behavior: Behavior normal.    Assessment/Plan: Please see individual problem list.  Preventative health care Assessment & Plan: Physical exam complete. Lab work as outlined, patient will return to complete this when fasting. Will contact with results. Pap- due  end of this month, she will follow up with her Ob-Gyn. Colon- due! Patient would like to do a Cologuard, order placed. Encouraged her to complete when it arrives. Mammo- due, order placed, encouraged to call and schedule. Flu vaccine- UTD. Tetanus vaccine- UTD. Shingles vaccine- series complete. Declined additional COVID vaccines. HIV screening deferred. Hep C screening negative. Recommended follow ups with Dentist and Ophthalmology for annual exams. Encouraged to continue working on healthy diet and exercising. Return to care in one year, sooner PRN.   Orders: -     CBC with Differential/Platelet; Future -     Comprehensive metabolic panel; Future  Postmenopausal -     Vitamin B12; Future -     VITAMIN D 25 Hydroxy (Vit-D Deficiency, Fractures); Future  Thyroid disorder screen -      TSH; Future  Lipid screening -     Lipid panel; Future  Screening mammogram for breast cancer -     3D Screening Mammogram, Left and Right; Future  Screen for colon cancer -     Cologuard   Return in about 1 year (around 11/23/2023) for Annual Exam.   Bethanie Dicker, NP-C [facility] [employer] [facility]

## 2022-11-28 ENCOUNTER — Encounter: Payer: Self-pay | Admitting: Nurse Practitioner

## 2022-11-28 NOTE — Assessment & Plan Note (Signed)
Physical exam complete. Lab work as outlined, patient will return to complete this when fasting. Will contact with results. Pap- due end of this month, she will follow up with her Ob-Gyn. Colon- due! Patient would like to do a Cologuard, order placed. Encouraged her to complete when it arrives. Mammo- due, order placed, encouraged to call and schedule. Flu vaccine- UTD. Tetanus vaccine- UTD. Shingles vaccine- series complete. Declined additional COVID vaccines. HIV screening deferred. Hep C screening negative. Recommended follow ups with Dentist and Ophthalmology for annual exams. Encouraged to continue working on healthy diet and exercising. Return to care in one year, sooner PRN.

## 2022-12-06 ENCOUNTER — Other Ambulatory Visit (INDEPENDENT_AMBULATORY_CARE_PROVIDER_SITE_OTHER): Payer: 59

## 2022-12-06 DIAGNOSIS — Z1329 Encounter for screening for other suspected endocrine disorder: Secondary | ICD-10-CM

## 2022-12-06 DIAGNOSIS — Z0000 Encounter for general adult medical examination without abnormal findings: Secondary | ICD-10-CM | POA: Diagnosis not present

## 2022-12-06 DIAGNOSIS — Z780 Asymptomatic menopausal state: Secondary | ICD-10-CM

## 2022-12-06 DIAGNOSIS — Z13220 Encounter for screening for lipoid disorders: Secondary | ICD-10-CM | POA: Diagnosis not present

## 2022-12-06 LAB — LIPID PANEL
Cholesterol: 186 mg/dL (ref 0–200)
HDL: 61.4 mg/dL (ref >39.00)
LDL Cholesterol: 104 mg/dL — ABNORMAL HIGH (ref 0–99)
NonHDL: 124.3
Total CHOL/HDL Ratio: 3
Triglycerides: 102 mg/dL (ref 0.0–149.0)
VLDL: 20.4 mg/dL (ref 0.0–40.0)

## 2022-12-06 LAB — CBC WITH DIFFERENTIAL/PLATELET
Basophils Absolute: 0 10*3/uL (ref 0.0–0.1)
Basophils Relative: 0.6 % (ref 0.0–3.0)
Eosinophils Absolute: 0.1 10*3/uL (ref 0.0–0.7)
Eosinophils Relative: 1.9 % (ref 0.0–5.0)
HCT: 42.6 % (ref 36.0–46.0)
Hemoglobin: 14.4 g/dL (ref 12.0–15.0)
Lymphocytes Relative: 38.3 % (ref 12.0–46.0)
Lymphs Abs: 2 10*3/uL (ref 0.7–4.0)
MCHC: 33.7 g/dL (ref 30.0–36.0)
MCV: 83 fl (ref 78.0–100.0)
Monocytes Absolute: 0.4 10*3/uL (ref 0.1–1.0)
Monocytes Relative: 8.5 % (ref 3.0–12.0)
Neutro Abs: 2.6 10*3/uL (ref 1.4–7.7)
Neutrophils Relative %: 50.7 % (ref 43.0–77.0)
Platelets: 323 10*3/uL (ref 150.0–400.0)
RBC: 5.14 Mil/uL — ABNORMAL HIGH (ref 3.87–5.11)
RDW: 13 % (ref 11.5–15.5)
WBC: 5.2 10*3/uL (ref 4.0–10.5)

## 2022-12-06 LAB — COMPREHENSIVE METABOLIC PANEL
ALT: 10 U/L (ref 0–35)
AST: 12 U/L (ref 0–37)
Albumin: 4.6 g/dL (ref 3.5–5.2)
Alkaline Phosphatase: 65 U/L (ref 39–117)
BUN: 15 mg/dL (ref 6–23)
CO2: 30 mEq/L (ref 19–32)
Calcium: 9.3 mg/dL (ref 8.4–10.5)
Chloride: 101 mEq/L (ref 96–112)
Creatinine, Ser: 0.68 mg/dL (ref 0.40–1.20)
GFR: 96.05 mL/min (ref >60.00)
Glucose, Bld: 105 mg/dL — ABNORMAL HIGH (ref 70–99)
Potassium: 3.7 mEq/L (ref 3.5–5.1)
Sodium: 140 mEq/L (ref 135–145)
Total Bilirubin: 0.6 mg/dL (ref 0.2–1.2)
Total Protein: 7.3 g/dL (ref 6.0–8.3)

## 2022-12-06 LAB — TSH: TSH: 2 u[IU]/mL (ref 0.35–5.50)

## 2022-12-06 LAB — VITAMIN D 25 HYDROXY (VIT D DEFICIENCY, FRACTURES): VITD: 32.64 ng/mL (ref 30.00–100.00)

## 2022-12-06 LAB — VITAMIN B12: Vitamin B-12: 233 pg/mL (ref 211–911)

## 2022-12-07 IMAGING — MG MM DIGITAL SCREENING BILAT W/ TOMO AND CAD
8 series · 9 of 24 positions shown · non-contrast
Comparison: Previous exam(s).

CLINICAL DATA: Screening.

EXAM:
DIGITAL SCREENING BILATERAL MAMMOGRAM WITH TOMOSYNTHESIS AND CAD
TECHNIQUE: Bilateral screening digital craniocaudal and mediolateral oblique
mammograms were obtained. Bilateral screening digital breast
tomosynthesis was performed. The images were evaluated with
computer-aided detection.

[R MLO synth-2D]
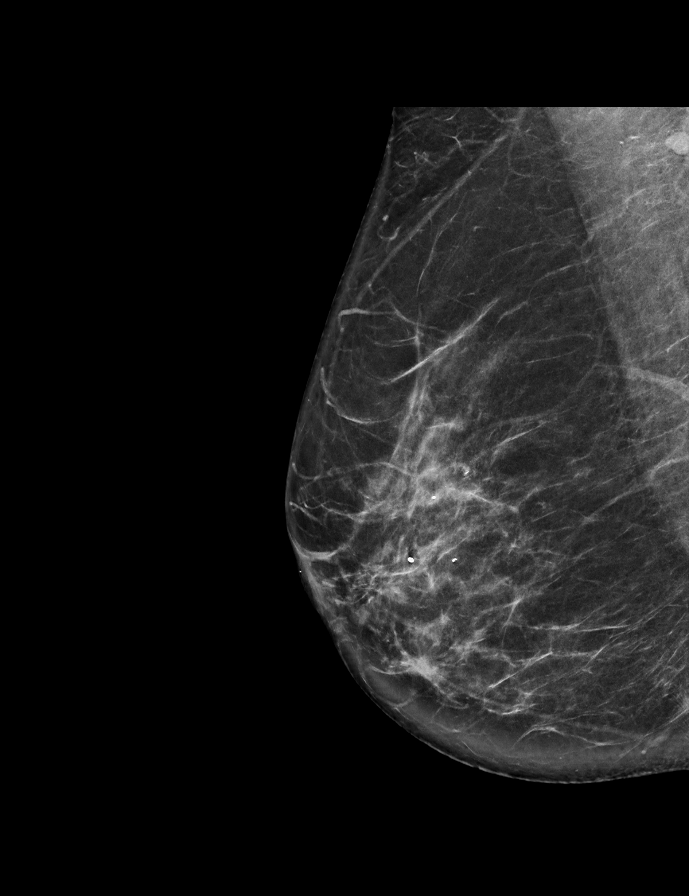

[L CC synth-2D]
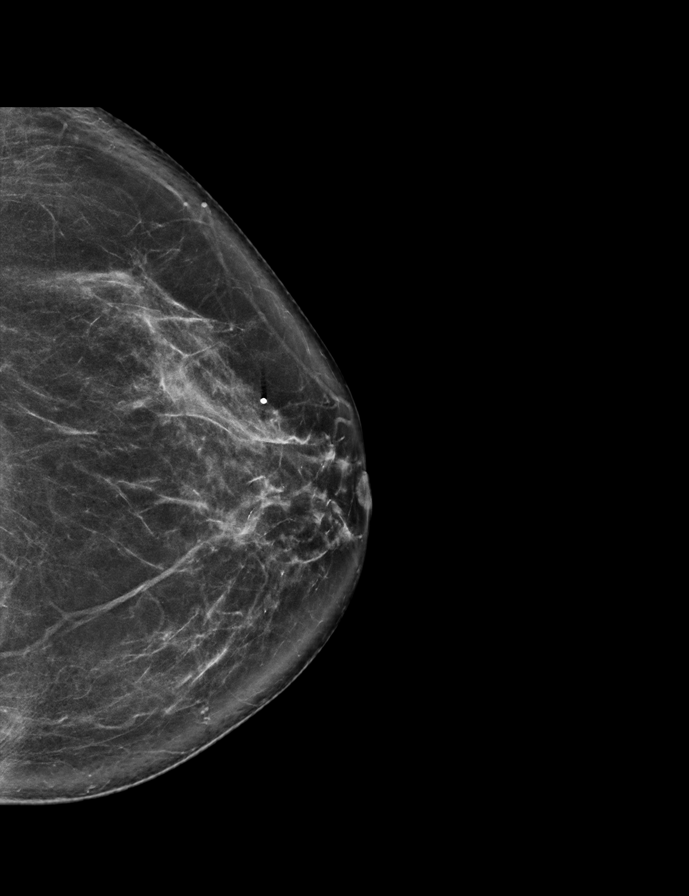

[L MLO synth-2D]
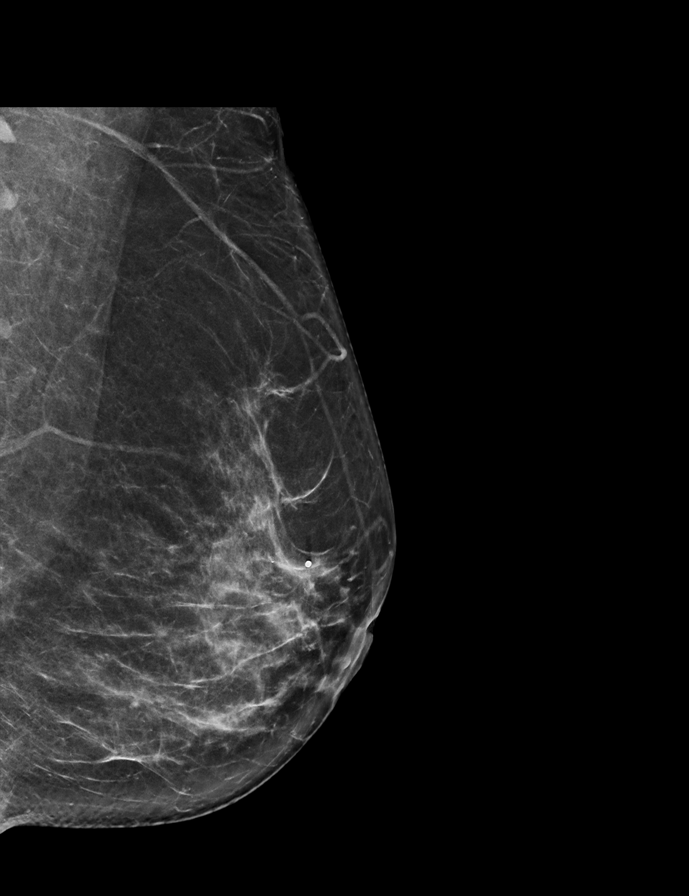

[R CC synth-2D]
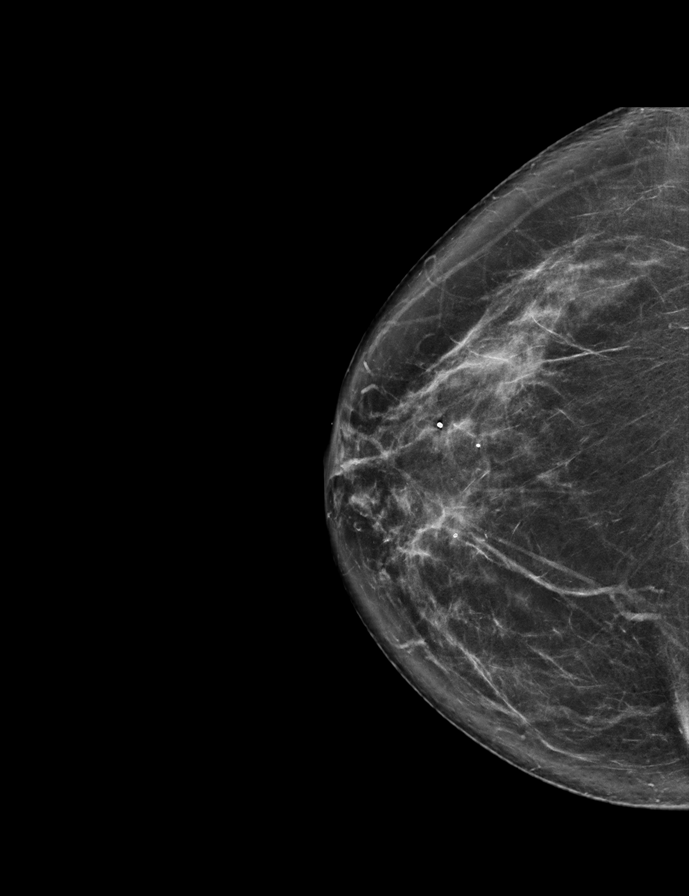

[L CC tomo · 2 of 72 frames shown]
[frame 24/72]
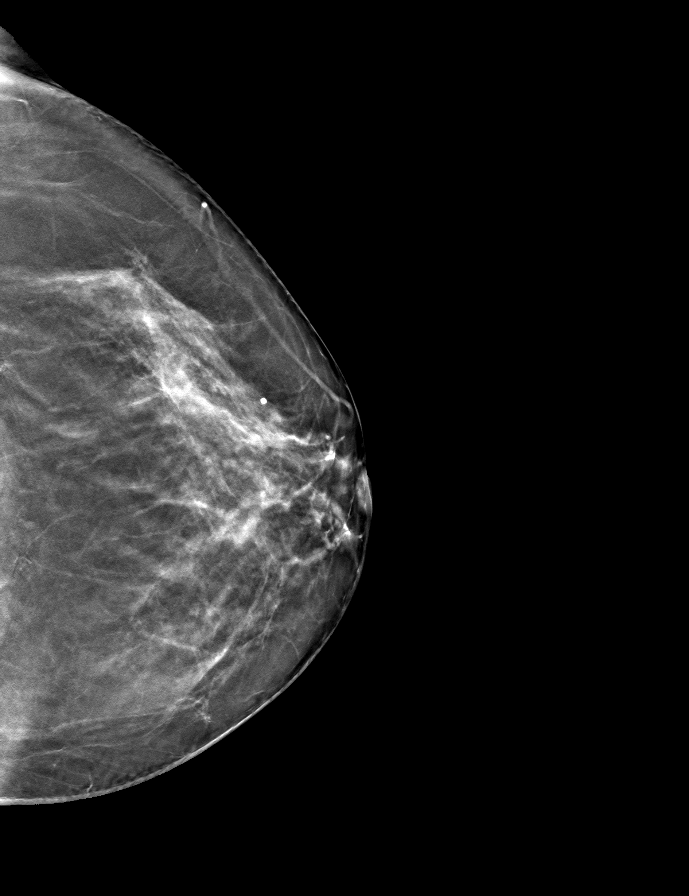
[frame 37/72]
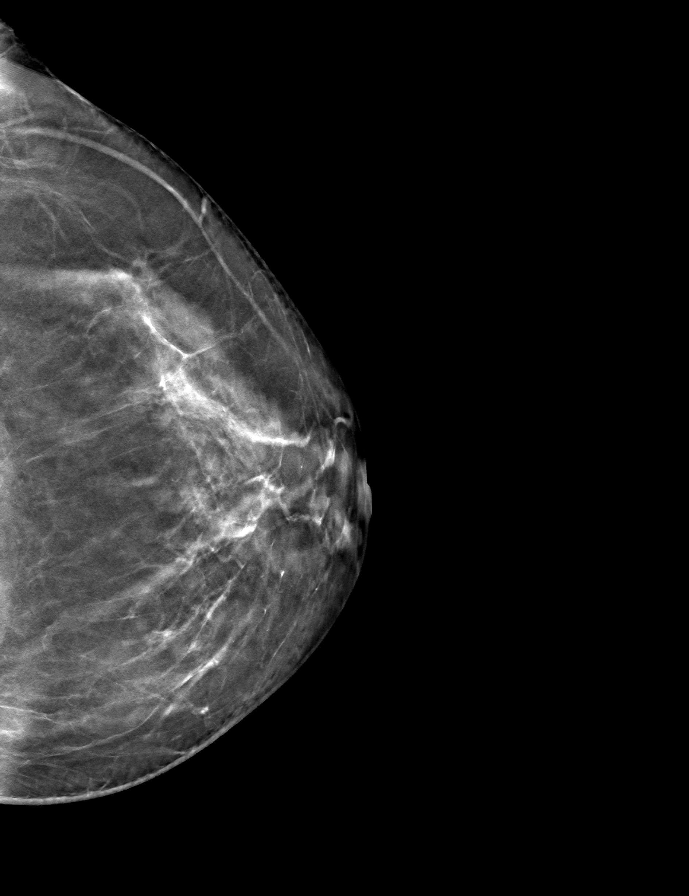

[R MLO tomo · tomo slice 38/75.0]
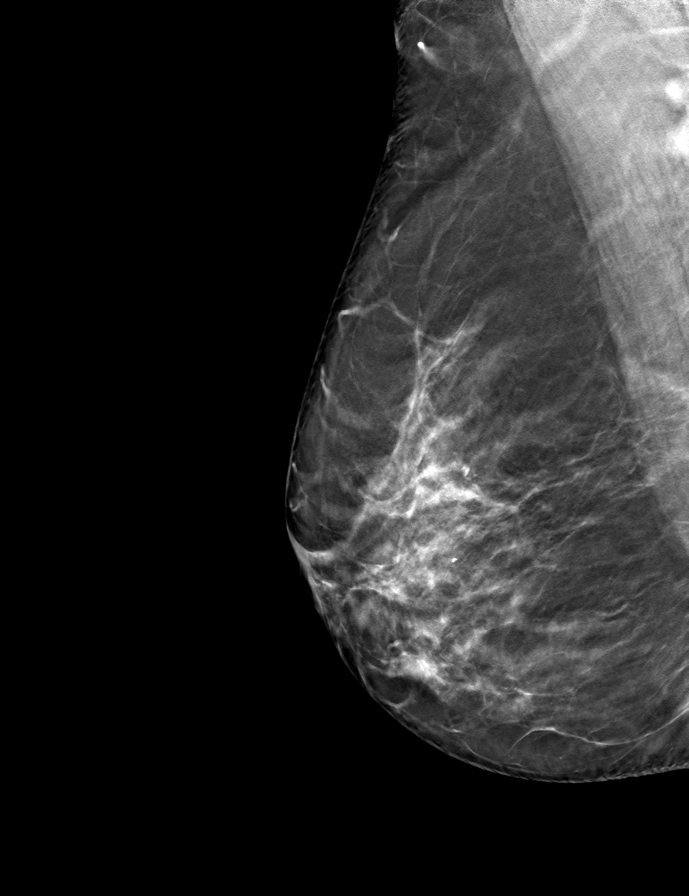

[R CC tomo · tomo slice 37/72.0]
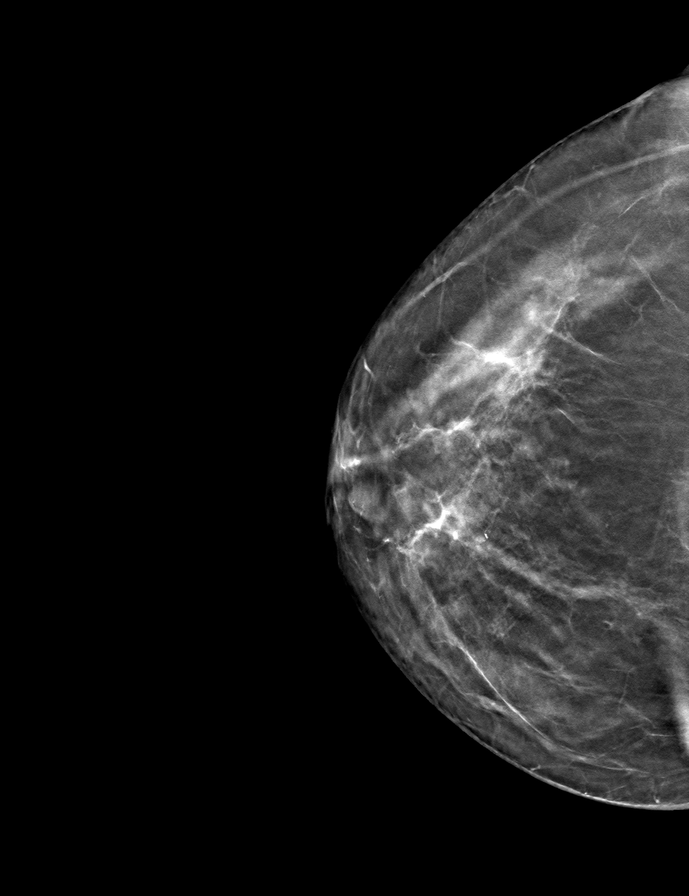

[L MLO tomo · tomo slice 37/74.0]
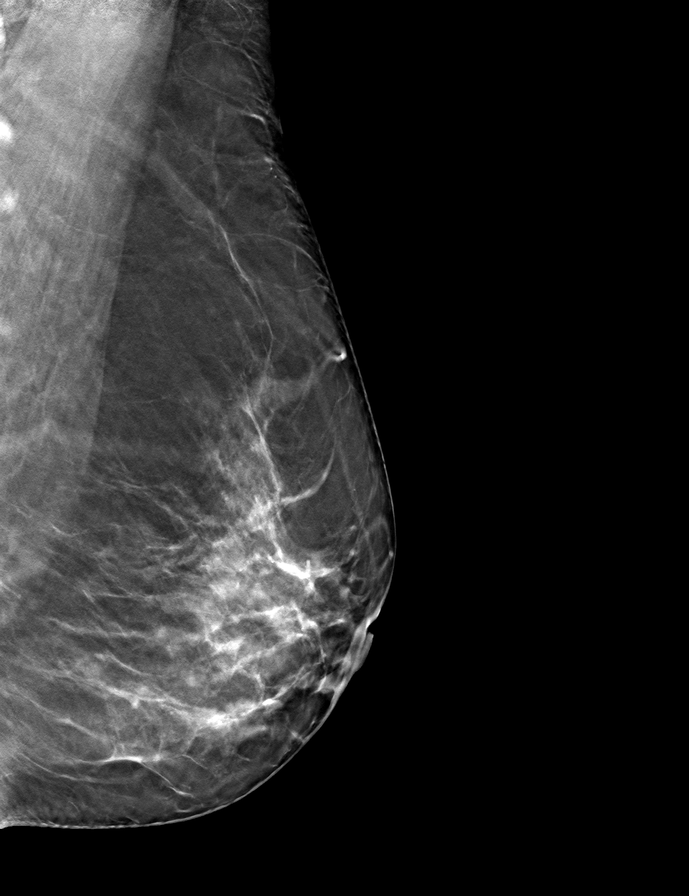

[9 of 24 positions shown; findings below may reference images not displayed]

ACR Breast Density Category c: The breast tissue is heterogeneously
dense, which may obscure small masses.
FINDINGS: There are no findings suspicious for malignancy.
IMPRESSION: No mammographic evidence of malignancy. A result letter of this
screening mammogram will be mailed directly to the patient.

RECOMMENDATION:
Screening mammogram in one year. (Code:Q3-W-BC3)

BI-RADS CATEGORY  1: Negative.

## 2022-12-16 ENCOUNTER — Ambulatory Visit: Payer: 59 | Admitting: Family Medicine

## 2022-12-23 DIAGNOSIS — Z1211 Encounter for screening for malignant neoplasm of colon: Secondary | ICD-10-CM | POA: Diagnosis not present

## 2022-12-29 LAB — COLOGUARD: COLOGUARD: NEGATIVE

## 2023-11-29 ENCOUNTER — Ambulatory Visit (INDEPENDENT_AMBULATORY_CARE_PROVIDER_SITE_OTHER): Payer: 59 | Admitting: Nurse Practitioner

## 2023-11-29 VITALS — BP 112/80 | HR 95 | Temp 98.2°F | Ht 67.5 in | Wt 153.2 lb

## 2023-11-29 DIAGNOSIS — Z13220 Encounter for screening for lipoid disorders: Secondary | ICD-10-CM | POA: Diagnosis not present

## 2023-11-29 DIAGNOSIS — Z1283 Encounter for screening for malignant neoplasm of skin: Secondary | ICD-10-CM

## 2023-11-29 DIAGNOSIS — Z780 Asymptomatic menopausal state: Secondary | ICD-10-CM | POA: Diagnosis not present

## 2023-11-29 DIAGNOSIS — Z1329 Encounter for screening for other suspected endocrine disorder: Secondary | ICD-10-CM | POA: Diagnosis not present

## 2023-11-29 DIAGNOSIS — Z0000 Encounter for general adult medical examination without abnormal findings: Secondary | ICD-10-CM | POA: Diagnosis not present

## 2023-11-29 DIAGNOSIS — Z1231 Encounter for screening mammogram for malignant neoplasm of breast: Secondary | ICD-10-CM | POA: Diagnosis not present

## 2023-11-29 NOTE — Patient Instructions (Signed)
 YOUR MAMMOGRAM IS DUE, PLEASE CALL AND GET THIS SCHEDULED! [facility] - call [phone]

## 2023-11-29 NOTE — Progress Notes (Signed)
 Wendy Burkitt, NP-C Phone: [phone]  Wendy Myers is a 58 y.o. female who presents today for annual exam.   Discussed the use of AI scribe software for clinical note transcription with the patient, who gave verbal consent to proceed.  History of Present Illness   Wendy Myers is a 58 year old female who presents for annual exam.  She has a history of eczema since childhood, which flares up with heat and sweating. She manages her symptoms with CeraVe.  She experiences pelvic floor weakness, particularly noticeable when running, leading to occasional urinary incontinence. This condition has been stable over the past five to six years. She manages it with exercises found online.  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 She attempts to improve her activity level by walking, running, and jogging with her husband, although not at her desired intensity. Her son, who is home from college, enjoys cooking, and she often prepares meals together, focusing on chicken and fish, as she does not consume red meat.  No new medical problems or changes in health status. No chest pain, shortness of breath, abdominal pain, headaches, dizziness, or mood disturbances. Sleep is good, with the ability to fall asleep quickly.      Social History   Tobacco Use  Smoking Status Never  Smokeless Tobacco Never    Current Outpatient Medications on File Prior to Visit  Medication Sig Dispense Refill   Multiple Vitamin (MULTIVITAMIN) capsule Take 1 capsule by mouth daily.       No current facility-administered medications on file prior to visit.     ROS see history of present illness  Objective  Physical Exam Vitals:   11/29/23 0813  BP: 112/80  Pulse: 95  Temp: 98.2 F (36.8 C)  SpO2: 95%    BP Readings from Last 3 Encounters:  11/29/23 112/80  11/23/22 118/78  12/14/21 112/70   Wt Readings from  Last 3 Encounters:  11/29/23 153 lb 3.2 oz (69.5 kg)  11/23/22 155 lb 3.2 oz (70.4 kg)  12/14/21 152 lb 9.6 oz (69.2 kg)    Physical Exam Constitutional:      General: She is not in acute distress.    Appearance: Normal appearance.  HENT:     Head: Normocephalic.     Right Ear: Tympanic membrane normal.     Left Ear: Tympanic membrane normal.     Nose: Nose normal.     Mouth/Throat:     Mouth: Mucous membranes are moist.     Pharynx: Oropharynx is clear.   Eyes:     Conjunctiva/sclera: Conjunctivae normal.     Pupils: Pupils are equal, round, and reactive to light.   Neck:     Thyroid : No thyromegaly.   Cardiovascular:     Rate and Rhythm: Normal rate and regular rhythm.     Heart sounds: Normal heart sounds.  Pulmonary:     Effort: Pulmonary effort is normal.     Breath sounds: Normal breath sounds.  Abdominal:     General: Abdomen is flat. Bowel sounds are normal.     Palpations: Abdomen is soft. There is no mass.     Tenderness: There is no abdominal tenderness.   Musculoskeletal:        General: Normal range of motion.  Lymphadenopathy:     Cervical: No cervical adenopathy.   Skin:    General: Skin is warm and dry.     Findings:  No rash.   Neurological:     General: No focal deficit present.     Mental Status: She is alert.   Psychiatric:        Mood and Affect: Mood normal.        Behavior: Behavior normal.      Assessment/Plan: Please see individual problem list.  Preventative health care Assessment & Plan: Physical exam complete. She will return to complete fasting lab work. Pap smear is up to date. Cologuard negative in 2024. Flu and tetanus vaccines are up to date. She has received 3 COVID vaccines and declines additional. Shingles vaccine series complete. Order a mammogram and encourage improved diet and exercise. Continue routine dental and eye exams. Return to care for labs then in one year, sooner as needed.   Orders: -     CBC with  Differential/Platelet; Future -     Comprehensive metabolic panel with GFR; Future  Postmenopausal -     VITAMIN D  25 Hydroxy (Vit-D Deficiency, Fractures); Future  Thyroid  disorder screen -     TSH; Future  Lipid screening -     Lipid panel; Future  Screening mammogram for breast cancer -     3D Screening Mammogram, Left and Right; Future  Skin cancer screening -     Ambulatory referral to Dermatology     Return for fasting labs then in one year for annual exam, sooner as needed.   Wendy Burkitt, NP-C [facility]

## 2023-12-08 ENCOUNTER — Encounter: Payer: Self-pay | Admitting: Nurse Practitioner

## 2023-12-08 DIAGNOSIS — Z780 Asymptomatic menopausal state: Secondary | ICD-10-CM | POA: Insufficient documentation

## 2023-12-08 NOTE — Assessment & Plan Note (Signed)
 Physical exam complete. She will return to complete fasting lab work. Pap smear is up to date. Cologuard negative in 2024. Flu and tetanus vaccines are up to date. She has received 3 COVID vaccines and declines additional. Shingles vaccine series complete. Order a mammogram and encourage improved diet and exercise. Continue routine dental and eye exams. Return to care for labs then in one year, sooner as needed.

## 2023-12-18 ENCOUNTER — Other Ambulatory Visit (INDEPENDENT_AMBULATORY_CARE_PROVIDER_SITE_OTHER)

## 2023-12-18 DIAGNOSIS — Z780 Asymptomatic menopausal state: Secondary | ICD-10-CM | POA: Diagnosis not present

## 2023-12-18 DIAGNOSIS — Z1329 Encounter for screening for other suspected endocrine disorder: Secondary | ICD-10-CM

## 2023-12-18 DIAGNOSIS — Z0000 Encounter for general adult medical examination without abnormal findings: Secondary | ICD-10-CM | POA: Diagnosis not present

## 2023-12-18 DIAGNOSIS — Z13220 Encounter for screening for lipoid disorders: Secondary | ICD-10-CM

## 2023-12-18 LAB — CBC WITH DIFFERENTIAL/PLATELET
Basophils Absolute: 0 10*3/uL (ref 0.0–0.1)
Basophils Relative: 0.5 % (ref 0.0–3.0)
Eosinophils Absolute: 0.1 10*3/uL (ref 0.0–0.7)
Eosinophils Relative: 1.6 % (ref 0.0–5.0)
HCT: 42.1 % (ref 36.0–46.0)
Hemoglobin: 14.1 g/dL (ref 12.0–15.0)
Lymphocytes Relative: 29.8 % (ref 12.0–46.0)
Lymphs Abs: 2 10*3/uL (ref 0.7–4.0)
MCHC: 33.5 g/dL (ref 30.0–36.0)
MCV: 82.5 fl (ref 78.0–100.0)
Monocytes Absolute: 0.5 10*3/uL (ref 0.1–1.0)
Monocytes Relative: 7.5 % (ref 3.0–12.0)
Neutro Abs: 4.1 10*3/uL (ref 1.4–7.7)
Neutrophils Relative %: 60.6 % (ref 43.0–77.0)
Platelets: 303 10*3/uL (ref 150.0–400.0)
RBC: 5.1 Mil/uL (ref 3.87–5.11)
RDW: 13 % (ref 11.5–15.5)
WBC: 6.8 10*3/uL (ref 4.0–10.5)

## 2023-12-18 LAB — VITAMIN D 25 HYDROXY (VIT D DEFICIENCY, FRACTURES): VITD: 33.59 ng/mL (ref 30.00–100.00)

## 2023-12-18 LAB — LIPID PANEL
Cholesterol: 186 mg/dL (ref 0–200)
HDL: 63.5 mg/dL (ref >39.00)
LDL Cholesterol: 103 mg/dL — ABNORMAL HIGH (ref 0–99)
NonHDL: 122.47
Total CHOL/HDL Ratio: 3
Triglycerides: 97 mg/dL (ref 0.0–149.0)
VLDL: 19.4 mg/dL (ref 0.0–40.0)

## 2023-12-18 LAB — COMPREHENSIVE METABOLIC PANEL WITH GFR
ALT: 12 U/L (ref 0–35)
AST: 13 U/L (ref 0–37)
Albumin: 4.5 g/dL (ref 3.5–5.2)
Alkaline Phosphatase: 65 U/L (ref 39–117)
BUN: 13 mg/dL (ref 6–23)
CO2: 29 meq/L (ref 19–32)
Calcium: 9 mg/dL (ref 8.4–10.5)
Chloride: 101 meq/L (ref 96–112)
Creatinine, Ser: 0.64 mg/dL (ref 0.40–1.20)
GFR: 96.76 mL/min (ref >60.00)
Glucose, Bld: 100 mg/dL — ABNORMAL HIGH (ref 70–99)
Potassium: 3.8 meq/L (ref 3.5–5.1)
Sodium: 139 meq/L (ref 135–145)
Total Bilirubin: 0.5 mg/dL (ref 0.2–1.2)
Total Protein: 6.9 g/dL (ref 6.0–8.3)

## 2023-12-18 LAB — TSH: TSH: 1.74 u[IU]/mL (ref 0.35–5.50)

## 2023-12-28 ENCOUNTER — Ambulatory Visit: Payer: Self-pay | Admitting: Nurse Practitioner

## 2024-04-03 ENCOUNTER — Encounter: Payer: Self-pay | Admitting: Physician Assistant

## 2024-04-03 ENCOUNTER — Ambulatory Visit: Admitting: Physician Assistant

## 2024-04-03 ENCOUNTER — Other Ambulatory Visit: Payer: Self-pay

## 2024-04-03 VITALS — BP 135/97 | HR 81

## 2024-04-03 DIAGNOSIS — L814 Other melanin hyperpigmentation: Secondary | ICD-10-CM | POA: Diagnosis not present

## 2024-04-03 DIAGNOSIS — L738 Other specified follicular disorders: Secondary | ICD-10-CM

## 2024-04-03 DIAGNOSIS — L821 Other seborrheic keratosis: Secondary | ICD-10-CM

## 2024-04-03 DIAGNOSIS — D2239 Melanocytic nevi of other parts of face: Secondary | ICD-10-CM

## 2024-04-03 DIAGNOSIS — D229 Melanocytic nevi, unspecified: Secondary | ICD-10-CM

## 2024-04-03 DIAGNOSIS — L578 Other skin changes due to chronic exposure to nonionizing radiation: Secondary | ICD-10-CM | POA: Diagnosis not present

## 2024-04-03 DIAGNOSIS — Z85828 Personal history of other malignant neoplasm of skin: Secondary | ICD-10-CM

## 2024-04-03 DIAGNOSIS — D1801 Hemangioma of skin and subcutaneous tissue: Secondary | ICD-10-CM

## 2024-04-03 DIAGNOSIS — Z1283 Encounter for screening for malignant neoplasm of skin: Secondary | ICD-10-CM | POA: Diagnosis not present

## 2024-04-03 DIAGNOSIS — W908XXA Exposure to other nonionizing radiation, initial encounter: Secondary | ICD-10-CM | POA: Diagnosis not present

## 2024-04-03 DIAGNOSIS — L309 Dermatitis, unspecified: Secondary | ICD-10-CM

## 2024-04-03 DIAGNOSIS — L308 Other specified dermatitis: Secondary | ICD-10-CM | POA: Diagnosis not present

## 2024-04-03 MED ORDER — CLOBETASOL PROPIONATE 0.05 % EX OINT: 1.0000 | TOPICAL_OINTMENT | Freq: Two times a day (BID) | CUTANEOUS | 1 refills | Status: DC

## 2024-04-03 MED ORDER — CLOBETASOL PROPIONATE 0.05 % EX OINT: 1.0000 | TOPICAL_OINTMENT | Freq: Two times a day (BID) | CUTANEOUS | 1 refills | Status: AC

## 2024-04-03 MED ORDER — CLOBETASOL PROPIONATE 0.05 % EX OINT
1.0000 | TOPICAL_OINTMENT | Freq: Two times a day (BID) | CUTANEOUS | 1 refills | Status: DC
  Filled 2024-04-03: qty 30, 30d supply, fill #0

## 2024-04-03 NOTE — Progress Notes (Signed)
 New Patient Visit   Subjective  Wendy Myers is a 58 y.o. female NEW PATIENT who presents for the following:  Total Body Skin Exam (TBSE).   Patient present today for new patient visit for TBSE.The patient denies she has spots, moles and lesions to be evaluated, some may be new or changing and the patient may have concern these could be cancer.   Past dermatology history includes to basal cell carcinomas (Left forehead / nose). Previous patient of [facility] and lastly at [facility].   The following portions of the chart were reviewed this encounter and updated as appropriate: medications, allergies, medical history  Review of Systems:  No other skin or systemic complaints except as noted in HPI or Assessment and Plan.  Objective  Well appearing patient in no apparent distress; mood and affect are within normal limits.  A full examination was performed including scalp, head, eyes, ears, nose, lips, neck, chest, axillae, abdomen, back, buttocks, bilateral upper extremities, bilateral lower extremities, hands, feet, fingers, toes, fingernails, and toenails. All findings within normal limits unless otherwise noted below.     Relevant exam findings are noted in the Assessment and Plan.    Assessment & Plan   HAND DERMATITIS Exam Scaly mimimal scale   Treatment Plan Continue clobetasol ointment   Recommend mild soap and moisturizing cream with hand washing.    FIBROUS PAPULE x 2 - NOSE  - 1-2 mm smooth symmetric flesh colored to pink papules of nose - Benign-appearing.  Observation.  Call clinic for new or changing lesions.   SEBACEOUS HYPERPLASIA - FACE  - Small yellow papules with a central dell - Benign-appearing - Observe. Call for changes.  LENTIGINES, SEBORRHEIC KERATOSES, HEMANGIOMAS - Benign normal skin lesions - Benign-appearing - Call for any changes  MELANOCYTIC NEVI - Tan-brown and/or pink-flesh-colored symmetric macules  and papules - Benign appearing on exam today - Observation - Call clinic for new or changing moles - Recommend daily use of broad spectrum spf 30+ sunscreen to sun-exposed areas.   ACTINIC DAMAGE - Chronic condition, secondary to cumulative UV/sun exposure - diffuse scaly erythematous macules with underlying dyspigmentation - Recommend daily broad spectrum sunscreen SPF 30+ to sun-exposed areas, reapply every 2 hours as needed.  - Recommend Differin 0.1% gel apply pea size amount to face nightly. - Staying in the shade or wearing long sleeves, sun glasses (UVA+UVB protection) and wide brim hats (4-inch brim around the entire circumference of the hat) are also recommended for sun protection.  - Call for new or changing lesions.  HISTORY OF BASAL CELL CARCINOMA OF RIGHT NOSE, LEFT FOREHEAD - No evidence of recurrence today - Recommend regular full body skin exams - Recommend daily broad spectrum sunscreen SPF 30+ to sun-exposed areas, reapply every 2 hours as needed.  - Call if any new or changing lesions are noted between office visits   SKIN CANCER SCREENING PERFORMED TODAY HAND DERMATITIS   FIBROUS PAPULE OF NOSE   SEBACEOUS HYPERPLASIA OF FACE   LENTIGINES   SEBORRHEIC KERATOSIS   CHERRY ANGIOMA   MULTIPLE BENIGN NEVI   ACTINIC SKIN DAMAGE   SCREENING EXAM FOR SKIN CANCER   HISTORY OF BASAL CELL CANCER    Return in about 1 year (around 04/03/2025) for TBSE History of BCC.  I, Roseline Hutchinson, CMA, am acting as scribe for Crystina Borrayo K, PA-C .   Documentation: I have reviewed the above documentation for accuracy and completeness, and I agree with the above.  Vora Clover K, PA-C

## 2024-04-03 NOTE — Patient Instructions (Addendum)

## 2024-04-13 ENCOUNTER — Other Ambulatory Visit: Payer: Self-pay

## 2024-11-29 ENCOUNTER — Encounter: Admitting: Nurse Practitioner

## 2025-04-07 ENCOUNTER — Ambulatory Visit: Admitting: Physician Assistant
